# Patient Record
Sex: Male | Born: 1955 | Race: White | Hispanic: No | Marital: Married | Smoking: Former smoker
Health system: Southern US, Community
[De-identification: ages and names within clinical notes are randomized; demographics above are authoritative.]

## PROBLEM LIST (undated history)

## (undated) DIAGNOSIS — T7840XA Allergy, unspecified, initial encounter: Secondary | ICD-10-CM

## (undated) DIAGNOSIS — M1990 Unspecified osteoarthritis, unspecified site: Secondary | ICD-10-CM

## (undated) DIAGNOSIS — Z8489 Family history of other specified conditions: Secondary | ICD-10-CM

## (undated) DIAGNOSIS — J329 Chronic sinusitis, unspecified: Secondary | ICD-10-CM

## (undated) DIAGNOSIS — I209 Angina pectoris, unspecified: Secondary | ICD-10-CM

## (undated) HISTORY — PX: TONSILLECTOMY: SUR1361

## (undated) HISTORY — PX: ROOT CANAL: SHX2363

## (undated) HISTORY — DX: Allergy, unspecified, initial encounter: T78.40XA

## (undated) HISTORY — DX: Unspecified osteoarthritis, unspecified site: M19.90

---

## 1998-08-29 ENCOUNTER — Encounter: Payer: Self-pay | Admitting: Family Medicine

## 1999-12-19 ENCOUNTER — Other Ambulatory Visit: Admission: RE | Admit: 1999-12-19 | Discharge: 1999-12-19 | Payer: Self-pay | Admitting: Urology

## 1999-12-19 ENCOUNTER — Encounter (INDEPENDENT_AMBULATORY_CARE_PROVIDER_SITE_OTHER): Payer: Self-pay | Admitting: Specialist

## 2007-03-25 ENCOUNTER — Encounter: Payer: Self-pay | Admitting: Family Medicine

## 2007-03-25 ENCOUNTER — Encounter: Admission: RE | Admit: 2007-03-25 | Discharge: 2007-03-25 | Payer: Self-pay | Admitting: Family Medicine

## 2007-03-30 ENCOUNTER — Ambulatory Visit: Payer: Self-pay | Admitting: Internal Medicine

## 2007-04-27 ENCOUNTER — Ambulatory Visit: Payer: Self-pay | Admitting: Internal Medicine

## 2007-07-14 HISTORY — PX: KNEE SURGERY: SHX244

## 2007-10-21 ENCOUNTER — Ambulatory Visit (HOSPITAL_COMMUNITY): Admission: RE | Admit: 2007-10-21 | Discharge: 2007-10-21 | Payer: Self-pay | Admitting: Orthopaedic Surgery

## 2008-09-21 ENCOUNTER — Encounter: Payer: Self-pay | Admitting: Family Medicine

## 2008-11-21 ENCOUNTER — Ambulatory Visit (HOSPITAL_COMMUNITY): Admission: RE | Admit: 2008-11-21 | Discharge: 2008-11-21 | Payer: Self-pay | Admitting: Orthopaedic Surgery

## 2009-09-24 ENCOUNTER — Ambulatory Visit: Payer: Self-pay | Admitting: Family Medicine

## 2009-09-25 LAB — CONVERTED CEMR LAB
ALT: 25 units/L (ref 0–53)
Albumin: 4.2 g/dL (ref 3.5–5.2)
BUN: 15 mg/dL (ref 6–23)
Basophils Relative: 0.2 % (ref 0.0–3.0)
CO2: 32 meq/L (ref 19–32)
Chloride: 109 meq/L (ref 96–112)
Cholesterol: 192 mg/dL (ref 0–200)
Creatinine, Ser: 1.1 mg/dL (ref 0.4–1.5)
Eosinophils Absolute: 0.3 10*3/uL (ref 0.0–0.7)
Eosinophils Relative: 4.9 % (ref 0.0–5.0)
HCT: 46.1 % (ref 39.0–52.0)
LDL Cholesterol: 113 mg/dL — ABNORMAL HIGH (ref 0–99)
Lymphs Abs: 1.3 10*3/uL (ref 0.7–4.0)
MCHC: 33.5 g/dL (ref 30.0–36.0)
MCV: 100.4 fL — ABNORMAL HIGH (ref 78.0–100.0)
Monocytes Absolute: 0.4 10*3/uL (ref 0.1–1.0)
Neutrophils Relative %: 62.2 % (ref 43.0–77.0)
PSA: 0.59 ng/mL (ref 0.10–4.00)
Potassium: 5.4 meq/L — ABNORMAL HIGH (ref 3.5–5.1)
RBC: 4.59 M/uL (ref 4.22–5.81)
TSH: 2.09 microintl units/mL (ref 0.35–5.50)
Total CHOL/HDL Ratio: 4
Total Protein: 6.3 g/dL (ref 6.0–8.3)
Triglycerides: 125 mg/dL (ref 0.0–149.0)
WBC: 5.2 10*3/uL (ref 4.5–10.5)

## 2010-08-12 NOTE — Assessment & Plan Note (Signed)
Summary: BRAND NEW PT/TO EST/PT REQ CPX/PT COMING IN FASTING/CJR/pt rs...   Vital Signs:  Patient profile:   55 year old male Height:      70.75 inches Weight:      185 pounds BMI:     26.08 Temp:     97.9 degrees F oral Pulse rate:   60 / minute Pulse rhythm:   regular Resp:     12 per minute BP sitting:   120 / 78  (left arm) Cuff size:   regular  Vitals Entered By: Sid Falcon LPN (September 24, 2009 9:06 AM)  Nutrition Counseling: Patient's BMI is greater than 25 and therefore counseled on weight management options. CC: New to establish,  pt fasting   History of Present Illness: New patient to establish care. Patient here for complete physical.    Past medical history reviewed. Patient has seasonal and perennial allergies and sees allergist regularly. History of frequent sinus infections in the past. Previous left knee arthroscopic knee surgery. He has some osteoarthritis involving the left knee. Medications reviewed and he uses fluticasone nasal and Astelin nasal sprays. No known drug allergies.  Immunizations up to date. Tetanus booster 2005. Upcoming trip to [place] and received second hepatitis A recently through health Department.  Brother with multiple sclerosis. Brother and mother diagnosed with brain tumors. Elevated lipids father.  Patient had screening colonoscopy normal 2009. No history of smoking. Exercises regularly. 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 Alcohol use-yes Smoker as teenager, quit  Risk Factors: Smoking Status: quit (09/24/2009)  Past Medical History: Arthritis left knee Chicken pox Hay  Fever, allergies  Past Surgical History: Tonsillectomy1965 Arthroscopy knee surgery 10/2007  Family History: Family History High cholesterol, father MS, Brother Brain Tumor, mother/ brother  Social History: 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 Alcohol use-yes Smoker as teenager, quit Smoking Status:  quit Occupation:  employed  Review of Systems  The patient denies anorexia, fever, weight loss, weight gain, vision loss, decreased hearing, hoarseness, chest pain, syncope, dyspnea on exertion, peripheral edema, prolonged cough, headaches, hemoptysis, abdominal pain, melena, hematochezia, severe indigestion/heartburn, hematuria, incontinence, genital sores, muscle weakness, suspicious skin lesions, transient blindness, difficulty walking, depression, unusual weight change, abnormal bleeding, enlarged lymph nodes, and testicular masses.         nonpainful cystic swelling left ankle which he noticed recently.  Physical Exam  General:  Well-developed,well-nourished,in no acute distress; alert,appropriate and cooperative throughout examination Head:  Normocephalic and atraumatic without obvious abnormalities. No apparent alopecia or balding. Eyes:  No corneal or conjunctival inflammation noted. EOMI. Perrla. Funduscopic exam benign, without hemorrhages, exudates or papilledema. Vision grossly normal. Ears:  External ear exam shows no significant lesions or deformities.  Otoscopic examination reveals clear canals, tympanic membranes are intact bilaterally without bulging, retraction, inflammation or discharge. Hearing is grossly normal bilaterally. Mouth:  Oral mucosa and oropharynx without lesions or exudates.  Teeth in good repair. Neck:  No deformities, masses, or tenderness noted. Lungs:  Normal respiratory effort, chest expands symmetrically. Lungs are clear to auscultation, no crackles or wheezes. Heart:  Normal rate and regular rhythm. S1 and S2 normal without gallop, murmur, click,  rub or other extra sounds. Abdomen:  Bowel sounds positive,abdomen soft and non-tender  without masses, organomegaly or hernias noted. Rectal:  No external abnormalities noted. Normal sphincter tone. No rectal masses or tenderness. Prostate:  Prostate gland firm and smooth, no enlargement, nodularity, tenderness, mass, asymmetry or induration. Extremities:  large ganglion cyst left ankle. This is movable and well demarcated. Neurologic:  No cranial nerve deficits noted. Station and gait are normal. Plantar reflexes are down-going bilaterally. DTRs are symmetrical throughout. Sensory, motor and coordinative functions appear intact. Skin:  Intact without suspicious lesions or rashes Psych:  normally interactive, good eye contact, not anxious appearing, and not depressed appearing.  normally interactive, good eye contact, not anxious appearing, and not depressed appearing.     Impression & Recommendations:  Problem # 1:  Preventive Health Care (ICD-V70.0) check screening labs. Colonoscopy up to date.  Exercising regularly.  Immunizations up to date.  Pt traveling out of country and requests antibiotic to use only if he develops severe sinus infection which he has been prone to in past.  Complete Medication List: 1)  Astelin 137 Mcg/spray Soln (Azelastine hcl) .... Onece daily 2)  Fluticasone Propionate 50 Mcg/act Susp (Fluticasone propionate) .... One spray daily 3)  Azithromycin 250 Mg Tabs (Azithromycin) .... Use as directed. 4)  Mens Multivitamin Plus Tabs (Multiple vitamins-minerals) .... Once daily 5)  Zyrtec Allergy 10 Mg Tabs (Cetirizine hcl) .... As needed  Other Orders: Venipuncture (29562) TLB-Lipid Panel (80061-LIPID) TLB-BMP (Basic Metabolic Panel-BMET) (80048-METABOL) TLB-CBC Platelet - w/Differential (85025-CBCD) TLB-Hepatic/Liver Function Pnl (80076-HEPATIC) TLB-TSH (Thyroid Stimulating Hormone) (84443-TSH) TLB-PSA (Prostate Specific Antigen) (84153-PSA)  Patient  Instructions: 1)  Please schedule a follow-up appointment in 1 year.  2)  It is important that you exercise reguarly at least 20 minutes 5 times a week. If you develop chest pain, have severe difficulty breathing, or feel very tired, stop exercising immediately and seek medical attention.  Prescriptions: AZITHROMYCIN 250 MG TABS (AZITHROMYCIN) Use as directed.  #6 x 0   Entered and Authorized by:   Evelena Peat MD   Signed by:   Evelena Peat MD on 09/24/2009   Method used:   Print then Give to Patient   RxID:   [id number]   Preventive Care Screening  Colonoscopy:    Date:  07/14/2007    Results:  normal

## 2010-08-12 NOTE — Letter (Signed)
Summary: [facility]   Imported By: Maryln Gottron 09/26/2009 12:50:45  _____________________________________________________________________  External Attachment:    Type:   Image     Comment:   External Document

## 2010-09-24 ENCOUNTER — Other Ambulatory Visit (INDEPENDENT_AMBULATORY_CARE_PROVIDER_SITE_OTHER): Payer: BC Managed Care – PPO | Admitting: Family Medicine

## 2010-09-24 DIAGNOSIS — Z0000 Encounter for general adult medical examination without abnormal findings: Secondary | ICD-10-CM

## 2010-09-24 DIAGNOSIS — E785 Hyperlipidemia, unspecified: Secondary | ICD-10-CM

## 2010-09-24 LAB — POCT URINALYSIS DIPSTICK
Blood, UA: NEGATIVE
Glucose, UA: NEGATIVE
Leukocytes, UA: NEGATIVE
Nitrite, UA: NEGATIVE
Spec Grav, UA: 1.02
Urobilinogen, UA: 0.2
pH, UA: 7

## 2010-09-24 LAB — CBC WITH DIFFERENTIAL/PLATELET
Basophils Relative: 0.3 % (ref 0.0–3.0)
Eosinophils Absolute: 0.4 10*3/uL (ref 0.0–0.7)
Hemoglobin: 16.1 g/dL (ref 13.0–17.0)
Lymphocytes Relative: 25.2 % (ref 12.0–46.0)
MCHC: 35 g/dL (ref 30.0–36.0)
Monocytes Relative: 7.6 % (ref 3.0–12.0)
Neutrophils Relative %: 58.9 % (ref 43.0–77.0)
Platelets: 141 10*3/uL — ABNORMAL LOW (ref 150.0–400.0)
RBC: 4.59 Mil/uL (ref 4.22–5.81)
WBC: 4.9 10*3/uL (ref 4.5–10.5)

## 2010-09-24 LAB — TSH: TSH: 2.1 u[IU]/mL (ref 0.35–5.50)

## 2010-09-24 LAB — BASIC METABOLIC PANEL
BUN: 17 mg/dL (ref 6–23)
CO2: 30 mEq/L (ref 19–32)
Calcium: 9 mg/dL (ref 8.4–10.5)
Creatinine, Ser: 1 mg/dL (ref 0.4–1.5)
GFR: 82.26 mL/min (ref >60.00)
Sodium: 141 mEq/L (ref 135–145)

## 2010-09-24 LAB — PSA: PSA: 0.64 ng/mL (ref 0.10–4.00)

## 2010-09-24 LAB — LIPID PANEL
HDL: 45.2 mg/dL (ref >39.00)
Total CHOL/HDL Ratio: 5
VLDL: 28 mg/dL (ref 0.0–40.0)

## 2010-09-24 LAB — HEPATIC FUNCTION PANEL
AST: 29 U/L (ref 0–37)
Alkaline Phosphatase: 67 U/L (ref 39–117)
Bilirubin, Direct: 0.1 mg/dL (ref 0.0–0.3)
Total Protein: 6.1 g/dL (ref 6.0–8.3)

## 2010-10-01 ENCOUNTER — Encounter: Payer: Self-pay | Admitting: Family Medicine

## 2010-10-02 ENCOUNTER — Ambulatory Visit (INDEPENDENT_AMBULATORY_CARE_PROVIDER_SITE_OTHER): Payer: BC Managed Care – PPO | Admitting: Family Medicine

## 2010-10-02 ENCOUNTER — Encounter: Payer: Self-pay | Admitting: Family Medicine

## 2010-10-02 VITALS — BP 120/80 | HR 72 | Temp 98.6°F | Resp 12 | Ht 70.75 in | Wt 185.0 lb

## 2010-10-02 DIAGNOSIS — Z0000 Encounter for general adult medical examination without abnormal findings: Secondary | ICD-10-CM

## 2010-10-02 NOTE — Progress Notes (Signed)
  Subjective:    Patient ID: Edwin [name], male    DOB: 11/08/1955, 55 y.o.   MRN: [id number]  HPI  patient here for complete physical examination. Only takes a few allergy medications. Tetanus up-to-date. Colonoscopy 2009 and normal. Exercises several days per week. No recent complaints.  Former runner but unable to run now following knee injury.   Review of Systems  Constitutional: Negative for fever, activity change, appetite change and fatigue.  HENT: Negative for ear pain, congestion and trouble swallowing.   Eyes: Negative for pain and visual disturbance.  Respiratory: Negative for cough, shortness of breath and wheezing.   Cardiovascular: Negative for chest pain and palpitations.  Gastrointestinal: Negative for nausea, vomiting, abdominal pain, diarrhea, constipation, blood in stool, abdominal distention and rectal pain.  Genitourinary: Negative for dysuria, hematuria and testicular pain.  Musculoskeletal: Negative for joint swelling and arthralgias.  Skin: Negative for rash.  Neurological: Negative for dizziness, syncope and headaches.  Hematological: Negative for adenopathy.  Psychiatric/Behavioral: Negative for confusion and dysphoric mood.       Objective:   Physical Exam  Constitutional: He is oriented to person, place, and time. He appears well-developed and well-nourished. No distress.  HENT:  Head: Normocephalic and atraumatic.  Right Ear: External ear normal.  Left Ear: External ear normal.  Mouth/Throat: Oropharynx is clear and moist.  Eyes: Conjunctivae and EOM are normal. Pupils are equal, round, and reactive to light.  Neck: Normal range of motion. Neck supple. No thyromegaly present.  Cardiovascular: Normal rate, regular rhythm and normal heart sounds.   No murmur heard. Pulmonary/Chest: No respiratory distress. He has no wheezes. He has no rales.  Abdominal: Soft. Bowel sounds are normal. He exhibits no distension and no mass. There is no tenderness.  There is no rebound and no guarding.  Musculoskeletal: He exhibits no edema.  Lymphadenopathy:    He has no cervical adenopathy.  Neurological: He is alert and oriented to person, place, and time. He displays normal reflexes. No cranial nerve deficit.  Skin: No rash noted.  Psychiatric: He has a normal mood and affect.          Assessment & Plan:   complete physical examination. Labs reviewed with patient. Lipids slightly elevated but overall very low risk for CAD. Continue regular exercise. Colonoscopy up-to-date. Tetanus up to date. Annual physical in one year

## 2010-11-25 NOTE — Assessment & Plan Note (Signed)
[name]                             PULMONARY OFFICE NOTE   NAME:[name], Edwin [name]                    MRN:          [id number]  DATE:04/27/2007                            DOB:          15-Feb-1956    HISTORY:  A 55 year old white male with a cough dating back at least 6  months, worse at bedtime, associated with evidence of sinusitis and  apparent polyposis with the recommendation that surgery be considered.  The patient said he is 80% better after his last round of therapy,  which included treated directed at both reflux and rhinitis, as well as  sinusitis, but he is aggravated to some extent because his wife still  complains that he has been coughing at night.  The only time he had  complete control of the cough was while he was on Tramadol.  Note, he is  only on Flonase now.   He denies any significant dyspnea.  He is having somewhat discolored  sputum mostly from the nose, not much actually coughing up, except  minimum in the morning.   PHYSICAL EXAMINATION:  He is a pleasant ambulatory white male who is  somewhat anxious, in no acute distress.  He is afebrile with stable vital signs.  HEENT:  Significant for mild to moderate turbinate edema.  Oropharynx is  clear.  NECK:  Supple without cervical adenopathy or tenderness.  Trachea is  midline.  No thyromegaly.  LUNG FIELDS:  Completely clear bilaterally to auscultation and  percussion with no cough elicited on inspiratory or expiratory  maneuvers.  HEART:  Regular rhythm without murmur, gallop, or rub.  ABDOMEN:  Soft and benign.  EXTREMITIES:  Warm without calf tenderness, cyanosis, clubbing, or  edema.   IMPRESSION:  An 80% improvement in cough without the need for any form  of chronic therapy is actually quite good, but clearly is not going to  be eradicated completely unless the underlying source for the cough, his  chronic sinus disease, is managed aggressively.  If Dr. Suszanne Conners feels  that  surgery is needed, then I would fully endorse it.   To treat him acutely today, I recommended another 10-day course of  Augmentin, and a 6-day course of prednisone starting at 40 mg and  tapering off.  I also recommended, as long as he is coughing, to use  Zegerid 1 nightly because of the cyclical nature of cough that develops  in patients who have postnasal drip syndrome aggravated by coughing,  which aggravates reflux in a cyclical fashion.   I explained all of this to the patient in detail giving him both graphic  and text formatted material, and also diagramed out for him the problem  he is facing in terms of control of sinus disease in a way that I  thought he could understand it clearly, and hopefully buy in to the  concept that if  medical therapy does not work, then surgery needs to be considered as a  logical next step until the problem is corrected.     Charlaine Dalton. Sherene Sires, MD, [facility]  Electronically Signed  MBW/MedQ  DD: 04/27/2007  DT: 04/28/2007  Job #: [id number]   cc:   Oley Balm. Georgina Pillion, M.D.  Newman Pies, MD

## 2010-11-25 NOTE — Assessment & Plan Note (Signed)
[facility]                             PULMONARY OFFICE NOTE   NAME:[name], [name]                    MRN:          [id number]  DATE:03/30/2007                            DOB:          [birth year]    REFERRING PHYSICIAN:  Oley Balm. Georgina [name], M.D.   REASON FOR CONSULTATION:  Cough.   HISTORY:  The patient's says that all of his adult life he has had two  bad colds typically a year. They always start up in his head and end up  causing significant post-nasal drainage and just a little cough.  Typically he responds to antibiotics, but now comes in with a new onset  cough that started about six months ago also with a head cold that went  into the chest.  At this time, not responsive to Zithromax, but  somewhat better after taking prednisone. He relapsed shortly thereafter  and now is a experiencing severe paroxysms of cough especially when he  lies down at night, associated with a tickle in his suprasternal notch  area and has undergone an evaluation which included a chest x-ray and  sinus CT scan showing pan sinusitis and therefore has been referred to  Dr.  Donaciano [name] [name]. The patient denies any purulent secretions at  present, fevers, chills, sweats, orthopnea, PND or leg swelling. He is  not short of breath when he is not coughing.   PAST MEDICAL HISTORY:  1. Absence of significant chronic medical problems.  2. Status post remote tonsillectomy.   ALLERGIES:  None known.   MEDICATIONS:  1. Do not include ACE inhibitors, but do include:  2. Zyrtec.  3. Flonase.  4. Nexium, which he is not convinced help him.   SOCIAL HISTORY:  He quit smoking in 1979 after only smoking for 8 years.  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   FAMILY HISTORY:  Is positive for allergies in parents.   REVIEW OF SYSTEMS:  Taken in detail and negative except as outlined  above.   PHYSICAL EXAMINATION:  This is a stoic, ambulatory white male in no  acute  distress. He does talk with somewhat of a nasal tone to his voice.  He has stable vital signs and stable blood pressure at 140/98.  HEENT: Reveals severe turbinate edema, much worse on the left than on  the right with mucopurulent material present. Oropharynx is clear.  NECK: Supple without cervical adenopathy or tenderness. Trachea is  midline without lymphadenopathy.  LUNGS: Lung fields are clear bilaterally to auscultation and percussion,  but he does cough on deep inspiratory maneuvers.  HEART: Regular rate and rhythm without murmur, gallop or rub.  ABDOMEN: Soft, benign.  EXTREMITIES: Warm without calf tenderness, cyanosis, clubbing or edema.   CHEST X-RAY: Shows what appears to be prominent nipple shadows. No  definite abnormalities otherwise.   Sinus CT scan shows pan sinusitis as noted.   IMPRESSION:  Refractory cough is secondary to rhinitis or post-nasal  drip syndrome. I believe he does have a cyclical cough and endorse  continuing treatment for reflux associated with cyclical cough and gave  him information on cyclical cough as well as a diet recommending the  following specifics:  1. First, I have deferred totally the management of chronic sinusitis      to Dr.  Dorma [name] [name].  2. For cough, use Mucinex DM two b.i.d. supplemented by tramadol 50 mg      every four hours. For the inflammatory component take prednisone 10      mg tablets to be tapered #14 to be tapered off for six days.  3. To treat reflux, I have recommended he continue both Nexium in the      morning and Zegerid at bedtime (since many of his symptoms flare at      bedtime), but explained to him that I cough in this case is      probably causing reflux which is contributing to cough rather than      being a primary problem.     Edwin Dalton. Sherene Sires, MD, [facility]  Electronically Signed    MBW/MedQ  DD: 03/30/2007  DT: 03/30/2007  Job #: [id number]   cc:   Edwin [name], M.D.  Edwin [name], M.D.

## 2011-09-25 ENCOUNTER — Other Ambulatory Visit (INDEPENDENT_AMBULATORY_CARE_PROVIDER_SITE_OTHER): Payer: BC Managed Care – PPO

## 2011-09-25 DIAGNOSIS — Z0000 Encounter for general adult medical examination without abnormal findings: Secondary | ICD-10-CM

## 2011-09-25 LAB — CBC WITH DIFFERENTIAL/PLATELET
Basophils Absolute: 0 10*3/uL (ref 0.0–0.1)
Eosinophils Absolute: 0.3 10*3/uL (ref 0.0–0.7)
Lymphocytes Relative: 27.2 % (ref 12.0–46.0)
MCHC: 33.5 g/dL (ref 30.0–36.0)
Neutro Abs: 2.9 10*3/uL (ref 1.4–7.7)
Neutrophils Relative %: 57.6 % (ref 43.0–77.0)
Platelets: 153 10*3/uL (ref 150.0–400.0)
RDW: 12.3 % (ref 11.5–14.6)

## 2011-09-25 LAB — BASIC METABOLIC PANEL
CO2: 32 mEq/L (ref 19–32)
Calcium: 9.7 mg/dL (ref 8.4–10.5)
Creatinine, Ser: 1 mg/dL (ref 0.4–1.5)
Glucose, Bld: 93 mg/dL (ref 70–99)

## 2011-09-25 LAB — HEPATIC FUNCTION PANEL
Albumin: 4.2 g/dL (ref 3.5–5.2)
Alkaline Phosphatase: 63 U/L (ref 39–117)
Bilirubin, Direct: 0.1 mg/dL (ref 0.0–0.3)
Total Bilirubin: 0.7 mg/dL (ref 0.3–1.2)

## 2011-09-25 LAB — LIPID PANEL
Cholesterol: 215 mg/dL — ABNORMAL HIGH (ref 0–200)
VLDL: 26.2 mg/dL (ref 0.0–40.0)

## 2011-09-25 LAB — POCT URINALYSIS DIPSTICK
Ketones, UA: NEGATIVE
Leukocytes, UA: NEGATIVE
Protein, UA: NEGATIVE

## 2011-09-28 ENCOUNTER — Other Ambulatory Visit: Payer: BC Managed Care – PPO

## 2011-10-05 ENCOUNTER — Encounter: Payer: Self-pay | Admitting: Family Medicine

## 2011-10-05 ENCOUNTER — Ambulatory Visit (INDEPENDENT_AMBULATORY_CARE_PROVIDER_SITE_OTHER): Payer: BC Managed Care – PPO | Admitting: Family Medicine

## 2011-10-05 VITALS — BP 130/70 | HR 72 | Temp 97.4°F | Resp 12 | Ht 70.34 in | Wt 187.0 lb

## 2011-10-05 DIAGNOSIS — Z0000 Encounter for general adult medical examination without abnormal findings: Secondary | ICD-10-CM

## 2011-10-05 MED ORDER — AZITHROMYCIN 250 MG PO TABS: ORAL_TABLET | ORAL | Status: AC

## 2011-10-05 MED ORDER — DESONIDE 0.05 % EX LOTN: TOPICAL_LOTION | Freq: Two times a day (BID) | CUTANEOUS | Status: AC

## 2011-10-05 NOTE — Progress Notes (Signed)
  Subjective:    Patient ID: Edwin [name], male    DOB: [birth year], 56 y.o.   MRN: [id number]  HPI  Complete physical. Patient has perennial allergies treated with multiple medications. Continues to see allergist for immunotherapy. Eczematous rash intermittently lower extremities. Has used Elocon lotion with good success and requesting refill. Upcoming travel to [place]. Frequent sinusitis history. Requesting antibiotic in event of flareup.  Tetanus is up-to-date.  Past Medical History  Diagnosis Date  . Allergy   . Arthritis    Past Surgical History  Procedure Date  . Knee surgery 2009    microfracture surgery    reports that he quit smoking about 34 years ago. His smoking use included Cigarettes. He has a 5 pack-year smoking history. He does not have any smokeless tobacco history on file. He reports that he drinks alcohol. His drug history not on file. family history includes Hyperlipidemia in his father and Multiple sclerosis in his brother. No Known Allergies    Review of Systems  Constitutional: Negative for fever, activity change, appetite change and fatigue.  HENT: Negative for ear pain, congestion and trouble swallowing.   Eyes: Negative for pain and visual disturbance.  Respiratory: Negative for cough, shortness of breath and wheezing.   Cardiovascular: Negative for chest pain and palpitations.  Gastrointestinal: Negative for nausea, vomiting, abdominal pain, diarrhea, constipation, blood in stool, abdominal distention and rectal pain.  Genitourinary: Negative for dysuria, hematuria and testicular pain.  Musculoskeletal: Negative for joint swelling and arthralgias.  Skin: Negative for rash.  Neurological: Negative for dizziness, syncope and headaches.  Hematological: Negative for adenopathy.  Psychiatric/Behavioral: Negative for confusion and dysphoric mood.       Objective:   Physical Exam  Constitutional: He is oriented to person, place, and time. He appears  well-developed and well-nourished. No distress.  HENT:  Head: Normocephalic and atraumatic.  Right Ear: External ear normal.  Left Ear: External ear normal.  Mouth/Throat: Oropharynx is clear and moist.  Eyes: Conjunctivae and EOM are normal. Pupils are equal, round, and reactive to light.  Neck: Normal range of motion. Neck supple. No thyromegaly present.  Cardiovascular: Normal rate, regular rhythm and normal heart sounds.   No murmur heard. Pulmonary/Chest: No respiratory distress. He has no wheezes. He has no rales.  Abdominal: Soft. Bowel sounds are normal. He exhibits no distension and no mass. There is no tenderness. There is no rebound and no guarding.  Musculoskeletal: He exhibits no edema.  Lymphadenopathy:    He has no cervical adenopathy.  Neurological: He is alert and oriented to person, place, and time. He displays normal reflexes. No cranial nerve deficit.  Skin: Rash noted.       Patient has nonspecific slightly excoriated dry scaly rash scattered lower extremities  Psychiatric: He has a normal mood and affect.          Assessment & Plan:  Healthy 56 year old male. Colonoscopy up to date. Tetanus is up to date. Discussed diet and exercise. Refill Elocon lotion for as needed use

## 2012-04-26 ENCOUNTER — Encounter: Payer: Self-pay | Admitting: Family Medicine

## 2012-04-26 ENCOUNTER — Telehealth: Payer: Self-pay | Admitting: Family Medicine

## 2012-04-26 ENCOUNTER — Ambulatory Visit (INDEPENDENT_AMBULATORY_CARE_PROVIDER_SITE_OTHER): Payer: BC Managed Care – PPO | Admitting: Family Medicine

## 2012-04-26 VITALS — BP 120/80 | HR 68 | Temp 98.3°F | Wt 190.0 lb

## 2012-04-26 DIAGNOSIS — J329 Chronic sinusitis, unspecified: Secondary | ICD-10-CM

## 2012-04-26 DIAGNOSIS — R0602 Shortness of breath: Secondary | ICD-10-CM

## 2012-04-26 MED ORDER — AZITHROMYCIN 250 MG PO TABS: ORAL_TABLET | ORAL | Status: DC

## 2012-04-26 NOTE — Progress Notes (Signed)
  Subjective:    Patient ID: Edwin [name], male    DOB: 01/05/1956, 56 y.o.   MRN: [id number]  HPI Here for several reasons. First for about 10 days he has had some slight intermittent SOB. No coughing or wheezing, no chest pains, no GERD symptoms. He works out at [employer] 4 days a week, and he has had no trouble with exercising. Also last night while lying in bed he felt a slight fluttering in the chest that lasted about 30 minutes and stopped. He has not had any such symptoms since then. He also thinks he is getting a sinus infection, which he typically gets twice a year. He has had sinus pressure, PND, ST, and a HA. No fever.    Review of Systems  Constitutional: Negative.   HENT: Positive for congestion, postnasal drip and sinus pressure.   Eyes: Negative.   Respiratory: Positive for chest tightness. Negative for cough, shortness of breath and wheezing.   Cardiovascular: Negative.        Objective:   Physical Exam  Constitutional: He appears well-developed and well-nourished. No distress.  HENT:  Right Ear: External ear normal.  Left Ear: External ear normal.  Nose: Nose normal.  Mouth/Throat: Oropharynx is clear and moist. No oropharyngeal exudate.  Eyes: Conjunctivae normal are normal.  Neck: No thyromegaly present.  Cardiovascular: Normal rate, regular rhythm and intact distal pulses.  Exam reveals no gallop and no friction rub.   No murmur heard.      EKG normal with borderline LAFB   Pulmonary/Chest: Effort normal and breath sounds normal.  Lymphadenopathy:    He has no cervical adenopathy.          Assessment & Plan:  He has early sinusitis which we will treat with a Zpack. The etiology of his other chest symptoms is unclear but I think a cardiac issue is unlikely. He will follow up prn

## 2012-04-26 NOTE — Telephone Encounter (Signed)
Caller: [name]/Patient; Patient Name: Edwin [name]; PCP: Evelena Peat [facility]); Best Callback Phone Number: [phone]; Reason for call: Mild chest pain x 10 d. Felt "heart palpitations" last PM. Today having mild SOB, no heart palpitations. Sx have not affected him physically, still planning on going to the gym. See in 4 hours per Breathing Problems Protocol. Appt sched for 1115 today w/Dr. Clent Ridges.

## 2012-05-11 ENCOUNTER — Telehealth: Payer: Self-pay | Admitting: Family Medicine

## 2012-05-11 MED ORDER — AZITHROMYCIN 250 MG PO TABS: ORAL_TABLET | ORAL | Status: DC

## 2012-05-11 NOTE — Telephone Encounter (Signed)
Per Dr. Clent Ridges, okay to send in a refill for Zpack, which I did send e-scribe and left voice message for pt.

## 2012-05-11 NOTE — Telephone Encounter (Signed)
Per [employer], patient picked up an rx for a zpac on 04/26/12. Per pharmacy patient is requesting another rx for a zpac as his symptoms still persist. Please advise/assist.

## 2012-05-11 NOTE — Telephone Encounter (Signed)
Pt was seen on 10/15 for sinus inf and never received his Z-Pac.

## 2012-05-11 NOTE — Telephone Encounter (Signed)
Pt was seen by Dr Clent Ridges for sinus inf and never received his RX for a Z-PAC. Pt states he is going out of town 05/12/12 and would appreciate today if possible. [facility] [address]  [phone]

## 2012-08-09 ENCOUNTER — Telehealth: Payer: Self-pay | Admitting: Family Medicine

## 2012-08-09 NOTE — Telephone Encounter (Signed)
Patient Information:  Caller Name: Jamonta  Phone: [phone]  Patient: Edwin [name], Edwin [name]  Gender: Male  DOB: 12-11-1955  Age: 57 Years  PCP: Evelena Peat [facility])  Office Follow Up:  Does the office need to follow up with this patient?: No  Instructions For The Office: N/A   Symptoms  Reason For Call & Symptoms: Pt calling for antibiotic for sinus congestion.  Offered appt.  Pt took a Z-Pac 3 weeks ago.  SX  restarted 07/26/12.  His wife has been sick.  He knows he needs an antibiotic and asked for an appt.   Reviewed Health History In EMR: N/A  Reviewed Medications In EMR: N/A  Reviewed Allergies In EMR: N/A  Reviewed Surgeries / Procedures: N/A  Date of Onset of Symptoms: 08/05/2012  Treatments Tried: Z-Pac 3 weeks ago.  Treatments Tried Worked: No  Guideline(s) Used:  Sinus Pain and Congestion  Disposition Per Guideline:   See Today or Tomorrow in Office  Reason For Disposition Reached:   Sinus congestion (pressure, fullness) present > 10 days  Advice Given:  Reassurance:   Usually home treatment with nasal washes can prevent an actual bacterial sinus infection.  Reassurance:   Usually home treatment with nasal washes can prevent an actual bacterial sinus infection.  Call Back If:   You become worse.  Appointment Scheduled:  08/10/2012 13:45:00 Appointment Scheduled Provider:  Evelena Peat [facility])

## 2012-08-10 ENCOUNTER — Ambulatory Visit (INDEPENDENT_AMBULATORY_CARE_PROVIDER_SITE_OTHER): Payer: BC Managed Care – PPO | Admitting: Family Medicine

## 2012-08-10 ENCOUNTER — Encounter: Payer: Self-pay | Admitting: Family Medicine

## 2012-08-10 VITALS — BP 120/72 | Temp 98.5°F

## 2012-08-10 DIAGNOSIS — J329 Chronic sinusitis, unspecified: Secondary | ICD-10-CM

## 2012-08-10 MED ORDER — AMOXICILLIN-POT CLAVULANATE 875-125 MG PO TABS: 1.0000 | ORAL_TABLET | Freq: Two times a day (BID) | ORAL | Status: DC

## 2012-08-10 NOTE — Progress Notes (Signed)
  Subjective:    Patient ID: Edwin [name], male    DOB: 1956-01-15, 57 y.o.   MRN: [id number]  HPI One month ago ?sinusitis and took Z-max and saw some improvement. One week ago recurrent symptoms of nasal colored discharge. No headaches.  PND.  Malaise.  No sore throat. No fever.  Bilateral facial pain.   Review of Systems  Constitutional: Negative for fever and chills.  HENT: Positive for congestion, postnasal drip and sinus pressure.   Respiratory: Positive for cough.   Neurological: Negative for headaches.       Objective:   Physical Exam  HENT:  Right Ear: External ear normal.  Left Ear: External ear normal.  Mouth/Throat: Oropharynx is clear and moist.  Neck: Neck supple.  Cardiovascular: Normal rate and regular rhythm.   Pulmonary/Chest: Effort normal and breath sounds normal. No respiratory distress. He has no wheezes. He has no rales.  Lymphadenopathy:    He has no cervical adenopathy.          Assessment & Plan:  Acute sinusitis.  Augmentin 875 mg po bid for 10 days.

## 2012-08-10 NOTE — Patient Instructions (Addendum)

## 2012-09-13 ENCOUNTER — Other Ambulatory Visit: Payer: BC Managed Care – PPO

## 2012-09-20 ENCOUNTER — Other Ambulatory Visit (INDEPENDENT_AMBULATORY_CARE_PROVIDER_SITE_OTHER): Payer: BC Managed Care – PPO

## 2012-09-20 DIAGNOSIS — Z0000 Encounter for general adult medical examination without abnormal findings: Secondary | ICD-10-CM

## 2012-09-20 LAB — POCT URINALYSIS DIPSTICK
Bilirubin, UA: NEGATIVE
Blood, UA: NEGATIVE
Glucose, UA: NEGATIVE
Spec Grav, UA: 1.02

## 2012-09-20 LAB — CBC WITH DIFFERENTIAL/PLATELET
Basophils Relative: 0.3 % (ref 0.0–3.0)
Eosinophils Absolute: 0.5 10*3/uL (ref 0.0–0.7)
Hemoglobin: 15.8 g/dL (ref 13.0–17.0)
Lymphs Abs: 1 10*3/uL (ref 0.7–4.0)
MCHC: 34.8 g/dL (ref 30.0–36.0)
MCV: 96.8 fl (ref 78.0–100.0)
Monocytes Absolute: 0.3 10*3/uL (ref 0.1–1.0)
Neutro Abs: 2.7 10*3/uL (ref 1.4–7.7)
Neutrophils Relative %: 60.2 % (ref 43.0–77.0)
RBC: 4.69 Mil/uL (ref 4.22–5.81)

## 2012-09-20 LAB — BASIC METABOLIC PANEL
BUN: 14 mg/dL (ref 6–23)
Calcium: 9.2 mg/dL (ref 8.4–10.5)
Creatinine, Ser: 1 mg/dL (ref 0.4–1.5)
GFR: 86.65 mL/min (ref >60.00)

## 2012-09-20 LAB — HEPATIC FUNCTION PANEL
AST: 29 U/L (ref 0–37)
Alkaline Phosphatase: 63 U/L (ref 39–117)
Total Bilirubin: 1.2 mg/dL (ref 0.3–1.2)

## 2012-09-20 LAB — TSH: TSH: 2.11 u[IU]/mL (ref 0.35–5.50)

## 2012-09-20 LAB — LDL CHOLESTEROL, DIRECT: Direct LDL: 143.8 mg/dL

## 2012-09-20 LAB — LIPID PANEL: Cholesterol: 215 mg/dL — ABNORMAL HIGH (ref 0–200)

## 2012-09-20 LAB — PSA: PSA: 0.74 ng/mL (ref 0.10–4.00)

## 2012-09-22 ENCOUNTER — Other Ambulatory Visit: Payer: BC Managed Care – PPO

## 2012-10-05 ENCOUNTER — Ambulatory Visit (INDEPENDENT_AMBULATORY_CARE_PROVIDER_SITE_OTHER): Payer: BC Managed Care – PPO | Admitting: Family Medicine

## 2012-10-05 ENCOUNTER — Encounter: Payer: Self-pay | Admitting: Family Medicine

## 2012-10-05 VITALS — BP 130/84 | HR 72 | Temp 98.5°F | Resp 12 | Wt 187.0 lb

## 2012-10-05 DIAGNOSIS — Z23 Encounter for immunization: Secondary | ICD-10-CM

## 2012-10-05 DIAGNOSIS — Z0000 Encounter for general adult medical examination without abnormal findings: Secondary | ICD-10-CM

## 2012-10-05 MED ORDER — LEVOFLOXACIN 500 MG PO TABS: 500.0000 mg | ORAL_TABLET | Freq: Every day | ORAL | Status: DC

## 2012-10-05 MED ORDER — PREDNISONE 20 MG PO TABS: ORAL_TABLET | ORAL | Status: DC

## 2012-10-05 NOTE — Addendum Note (Signed)
Addended by: Melchor Amour on: 10/05/2012 09:53 AM   Modules accepted: Orders

## 2012-10-05 NOTE — Progress Notes (Signed)
  Subjective:    Patient ID: Edwin [name], male    DOB: October 26, 1955, 57 y.o.   MRN: [id number]  HPI Here for complete physical He has history of perennial allergies and sees allergist. Recent sinusitis issues. Was treated with Zithromax and subsequently Augmentin. Still has some greenish nasal discharge and congestion. Previously years ago was treated with combination of prednisone and antibiotic and clear.  Last tetanus 2005. Colonoscopy 2009. Past medical history, family history, social history reviewed and as below. Exercises with tennis.  Past Medical History  Diagnosis Date  . Allergy   . Arthritis    Past Surgical History  Procedure Laterality Date  . Knee surgery  2009    microfracture surgery    reports that he quit smoking about 35 years ago. His smoking use included Cigarettes. He has a 5 pack-year smoking history. He has never used smokeless tobacco. He reports that he drinks about 2.0 ounces of alcohol per week. He reports that he does not use illicit drugs. family history includes Dementia in his father; Hyperlipidemia in his father; and Multiple sclerosis in his brother. No Known Allergies    Review of Systems  Constitutional: Negative for fever, activity change, appetite change and fatigue.  HENT: Positive for congestion and sinus pressure. Negative for ear pain and trouble swallowing.   Eyes: Negative for pain and visual disturbance.  Respiratory: Positive for cough. Negative for shortness of breath and wheezing.   Cardiovascular: Negative for chest pain and palpitations.  Gastrointestinal: Negative for nausea, vomiting, abdominal pain, diarrhea, constipation, blood in stool, abdominal distention and rectal pain.  Genitourinary: Negative for dysuria, hematuria and testicular pain.  Musculoskeletal: Negative for joint swelling and arthralgias.  Skin: Negative for rash.  Neurological: Negative for dizziness, syncope and headaches.  Hematological: Negative for  adenopathy.  Psychiatric/Behavioral: Negative for confusion and dysphoric mood.       Objective:   Physical Exam  Constitutional: He is oriented to person, place, and time. He appears well-developed and well-nourished. No distress.  HENT:  Head: Normocephalic and atraumatic.  Right Ear: External ear normal.  Left Ear: External ear normal.  Mouth/Throat: Oropharynx is clear and moist.  Eyes: Conjunctivae and EOM are normal. Pupils are equal, round, and reactive to light.  Neck: Normal range of motion. Neck supple. No thyromegaly present.  Cardiovascular: Normal rate, regular rhythm and normal heart sounds.   No murmur heard. Pulmonary/Chest: No respiratory distress. He has no wheezes. He has no rales.  Abdominal: Soft. Bowel sounds are normal. He exhibits no distension and no mass. There is no tenderness. There is no rebound and no guarding.  Genitourinary: Rectum normal and prostate normal.  Musculoskeletal: He exhibits no edema.  Lymphadenopathy:    He has no cervical adenopathy.  Neurological: He is alert and oriented to person, place, and time. He displays normal reflexes. No cranial nerve deficit.  Skin: No rash noted.  Psychiatric: He has a normal mood and affect.          Assessment & Plan:  Complete physical. Tetanus booster given. Labs reviewed with no significant abnormalities. Colonoscopy up to date Persistent sinusitis symptoms. Prednisone 20 mg 2 tablets daily for 7 days and Levaquin 500 mg once daily for 10 days. Consider limited CT maxillofacial if no improvement in 2 weeks

## 2013-01-16 ENCOUNTER — Telehealth: Payer: Self-pay | Admitting: Family Medicine

## 2013-01-16 MED ORDER — SILDENAFIL CITRATE 100 MG PO TABS: 50.0000 mg | ORAL_TABLET | Freq: Every day | ORAL | Status: DC | PRN

## 2013-01-16 NOTE — Telephone Encounter (Signed)
Please advise 

## 2013-01-16 NOTE — Telephone Encounter (Signed)
Patient Information:  Caller Name: Edwin [name]  Phone: [phone]  Patient: Edwin [name], Edwin [name]  Gender: Male  DOB: [birth year]  Age: 57 Years  PCP: Evelena Peat [facility])  Office Follow Up:  Does the office need to follow up with this patient?: No  Instructions For The Office: N/A  RN Note:  Patient had question concerning prescription received 01/16/2013.  Pharmacist informed patient that the cost for this was $175.00.  Caller asked if we had any coupon available.  I referred him to the following website:  [email or web address].  RN advised him to call back if he had any issues getting the free trial offered on website.  Symptoms  Reason For Call & Symptoms: Patient had question concerning prescription received 01/16/2013.  Pharmacist informed patient that the cost for this was $175.00.  Caller asked if we had any coupon available.  I referred him to the following website:  [email or web address].[email or web address].  RN advised him to call back if he had any issues getting the free trial offered on website.  Reviewed Health History In EMR: Yes  Reviewed Medications In EMR: Yes  Reviewed Allergies In EMR: Yes  Reviewed Surgeries / Procedures: Yes  Date of Onset of Symptoms: 01/16/2013  Guideline(s) Used:  No Protocol Available - Information Only  Disposition Per Guideline:   Home Care  Reason For Disposition Reached:   Information only question and nurse able to answer  Advice Given:  Call Back If:  New symptoms develop  You become worse.  Patient Will Follow Care Advice:  YES

## 2013-01-16 NOTE — Telephone Encounter (Signed)
Okay to start Viagra 100 mg one half to one tablet daily as needed, dispense #60 tablets with 6 refills

## 2013-01-16 NOTE — Telephone Encounter (Signed)
Rx request to pharmacy/SLS  

## 2013-01-16 NOTE — Telephone Encounter (Signed)
Patient Information:  Caller Name: Edwin [name]  Phone: [phone]  Patient: Edwin, [name]  Gender: Male  DOB: May 22, 1956  Age: 57 Years  PCP: Evelena Peat (Family Practice)  Office Follow Up:  Does the office need to follow up with this patient?: Yes  Instructions For The Office: OFFICE PLEASE REVIEW REGARDING STARTING NEW RX OF VIAGRA PER PT REQUEST  RN Note:  Instructed pt to check back at pharmacy today  or if there is any issues office will call him back; will comply  Symptoms  Reason For Call & Symptoms: Pt is calling and states that he would like to get a new Rx for Viagra; sx started approx 1 year ago;  pt states that he is having a hard time maintaining an erection;  pt was last seen in March 2014; pt denies any significant hx or medication use;  he has talked to other men and would like to try the Viagra over the "other ones" out there;  [employer]  [phone];  Reviewed Health History In EMR: Yes  Reviewed Medications In EMR: Yes  Reviewed Allergies In EMR: Yes  Reviewed Surgeries / Procedures: Yes  Date of Onset of Symptoms: Unknown  Guideline(s) Used:  No Protocol Available - Information Only  Disposition Per Guideline:   Discuss with PCP and Callback by Nurse Today  Reason For Disposition Reached:   Nursing judgment  Advice Given:  Call Back If:  New symptoms develop  Patient Will Follow Care Advice:  YES

## 2013-08-21 ENCOUNTER — Encounter: Payer: Self-pay | Admitting: Family Medicine

## 2013-08-21 ENCOUNTER — Ambulatory Visit (INDEPENDENT_AMBULATORY_CARE_PROVIDER_SITE_OTHER): Payer: BC Managed Care – PPO | Admitting: Family Medicine

## 2013-08-21 VITALS — BP 130/72 | HR 71 | Temp 97.7°F | Wt 187.0 lb

## 2013-08-21 DIAGNOSIS — J329 Chronic sinusitis, unspecified: Secondary | ICD-10-CM

## 2013-08-21 MED ORDER — AZITHROMYCIN 250 MG PO TABS: ORAL_TABLET | ORAL | Status: DC

## 2013-08-21 NOTE — Progress Notes (Signed)
   Subjective:    Patient ID: Edwin [name], male    DOB: August 31, 1955, 58 y.o.   MRN: [id number]  Sinus Problem Associated symptoms include congestion, coughing and headaches. Pertinent negatives include no chills or ear pain.   Patient seen with over one week history of progressive sinus pressure and pain. He's had some yellow-green nasal discharge. Occasional cough. Occasional headaches. Progressive maxillary facial pain bilaterally. He has taken over-the-counter medications but has had progressive symptoms. He has regular allergies and sees an allergist. He is chronically on antihistamines and nasal steroid sprays. He's had almost 2 full weeks of sinus congestive symptoms.  Past Medical History  Diagnosis Date  . Allergy   . Arthritis    Past Surgical History  Procedure Laterality Date  . Knee surgery  2009    microfracture surgery    reports that he quit smoking about 35 years ago. His smoking use included Cigarettes. He has a 5 pack-year smoking history. He has never used smokeless tobacco. He reports that he drinks about 2.0 ounces of alcohol per week. He reports that he does not use illicit drugs. family history includes Dementia in his father; Hyperlipidemia in his father; Multiple sclerosis in his brother. No Known Allergies    Review of Systems  Constitutional: Negative for fever, chills and fatigue.  HENT: Positive for congestion. Negative for ear discharge and ear pain.   Respiratory: Positive for cough.   Neurological: Positive for headaches.       Objective:   Physical Exam  Constitutional: He appears well-developed and well-nourished.  HENT:  Right Ear: External ear normal.  Left Ear: External ear normal.  Mouth/Throat: Oropharynx is clear and moist.  Erythematous nasal mucosa. Otherwise normal  Neck: Neck supple.  Cardiovascular: Normal rate.   Pulmonary/Chest: Effort normal and breath sounds normal. No respiratory distress. He has no wheezes. He has no  rales.  Lymphadenopathy:    He has no cervical adenopathy.          Assessment & Plan:  Acute sinusitis. Zithromax for 5 days. Followup as needed

## 2013-08-21 NOTE — Patient Instructions (Signed)

## 2013-08-31 ENCOUNTER — Telehealth: Payer: Self-pay | Admitting: Family Medicine

## 2013-08-31 MED ORDER — LEVOFLOXACIN 500 MG PO TABS: 500.0000 mg | ORAL_TABLET | Freq: Every day | ORAL | Status: DC

## 2013-08-31 NOTE — Telephone Encounter (Signed)
Pt was just in on last week. Not feeling better, he and Dr. B discussed taking the z pack however it did not work.  Wants to know can a stronger antibiotic be called in, ?levaflaxin?  Please advise.

## 2013-08-31 NOTE — Telephone Encounter (Signed)
Levaquin 500 mg once daily for 10 days. If not improving with that we need to consider sinus x-rays

## 2013-08-31 NOTE — Telephone Encounter (Signed)
RX sent to pharmacy and Pt informed 

## 2013-09-12 ENCOUNTER — Telehealth: Payer: Self-pay | Admitting: Family Medicine

## 2013-09-12 NOTE — Telephone Encounter (Signed)
Pt has appt on thurs, but wants to know if you would rx  a cough med for him? Pt states he has coughed non stop for 2 days. [facility] Pt aware dr is out of the office this afternoon and ok w/ that.

## 2013-09-13 ENCOUNTER — Ambulatory Visit (INDEPENDENT_AMBULATORY_CARE_PROVIDER_SITE_OTHER): Payer: BC Managed Care – PPO | Admitting: Family Medicine

## 2013-09-13 ENCOUNTER — Encounter: Payer: Self-pay | Admitting: Family Medicine

## 2013-09-13 VITALS — BP 130/72 | HR 95 | Temp 98.8°F | Wt 184.0 lb

## 2013-09-13 DIAGNOSIS — J209 Acute bronchitis, unspecified: Secondary | ICD-10-CM

## 2013-09-13 MED ORDER — HYDROCODONE-HOMATROPINE 5-1.5 MG/5ML PO SYRP: 5.0000 mL | ORAL_SOLUTION | Freq: Four times a day (QID) | ORAL | Status: DC | PRN

## 2013-09-13 NOTE — Progress Notes (Signed)
   Subjective:    Patient ID: Edwin [name], male    DOB: 10/13/1955, 58 y.o.   MRN: [id number]  Cough Associated symptoms include chills, a fever and myalgias. Pertinent negatives include no rash or sore throat.   Acute visit. Patient recently got over sinus infection after taking Zithromax and subsequently Levaquin. He did feel totally recovered following that but within about 5 days ago developed some cough and body aches. 3 day history of low-grade fever. Minimal diarrhea. Rhinorrhea. Intermittent chills. No nausea or vomiting. Cough is especially bothersome. Tried Robitussin over-the-counter without much improvement. Patient nonsmoker. No dyspnea  Past Medical History  Diagnosis Date  . Allergy   . Arthritis    Past Surgical History  Procedure Laterality Date  . Knee surgery  2009    microfracture surgery    reports that he quit smoking about 35 years ago. His smoking use included Cigarettes. He has a 5 pack-year smoking history. He has never used smokeless tobacco. He reports that he drinks about 2.0 ounces of alcohol per week. He reports that he does not use illicit drugs. family history includes Dementia in his father; Hyperlipidemia in his father; Multiple sclerosis in his brother. No Known Allergies    Review of Systems  Constitutional: Positive for fever, chills and fatigue.  HENT: Positive for congestion. Negative for sore throat.   Respiratory: Positive for cough.   Musculoskeletal: Positive for myalgias.  Skin: Negative for rash.       Objective:   Physical Exam  Constitutional: He appears well-developed and well-nourished.  HENT:  Right Ear: External ear normal.  Left Ear: External ear normal.  Mouth/Throat: Oropharynx is clear and moist.  Neck: Neck supple.  Cardiovascular: Normal rate and regular rhythm.   Pulmonary/Chest: Effort normal and breath sounds normal. No respiratory distress. He has no wheezes. He has no rales.  Lymphadenopathy:    He has no  cervical adenopathy.          Assessment & Plan:  Viral syndrome. Nonfocal exam. Treat symptomatically. Hycodan cough syrup for nighttime suppression as needed.

## 2013-09-13 NOTE — Progress Notes (Signed)
Pre visit review using our clinic review tool, if applicable. No additional management support is needed unless otherwise documented below in the visit note. 

## 2013-09-13 NOTE — Patient Instructions (Signed)
Acute Bronchitis Bronchitis is inflammation of the airways that extend from the windpipe into the lungs (bronchi). The inflammation often causes mucus to develop. This leads to a cough, which is the most common symptom of bronchitis.  In acute bronchitis, the condition usually develops suddenly and goes away over time, usually in a couple weeks. Smoking, allergies, and asthma can make bronchitis worse. Repeated episodes of bronchitis may cause further lung problems.  CAUSES Acute bronchitis is most often caused by the same virus that causes a cold. The virus can spread from person to person (contagious).  SIGNS AND SYMPTOMS   Cough.   Fever.   Coughing up mucus.   Body aches.   Chest congestion.   Chills.   Shortness of breath.   Sore throat.  DIAGNOSIS  Acute bronchitis is usually diagnosed through a physical exam. Tests, such as chest X-rays, are sometimes done to rule out other conditions.  TREATMENT  Acute bronchitis usually goes away in a couple weeks. Often times, no medical treatment is necessary. Medicines are sometimes given for relief of fever or cough. Antibiotics are usually not needed but may be prescribed in certain situations. In some cases, an inhaler may be recommended to help reduce shortness of breath and control the cough. A cool mist vaporizer may also be used to help thin bronchial secretions and make it easier to clear the chest.  HOME CARE INSTRUCTIONS  Get plenty of rest.   Drink enough fluids to keep your urine clear or pale yellow (unless you have a medical condition that requires fluid restriction). Increasing fluids may help thin your secretions and will prevent dehydration.   Only take over-the-counter or prescription medicines as directed by your health care provider.   Avoid smoking and secondhand smoke. Exposure to cigarette smoke or irritating chemicals will make bronchitis worse. If you are a smoker, consider using nicotine gum or skin  patches to help control withdrawal symptoms. Quitting smoking will help your lungs heal faster.   Reduce the chances of another bout of acute bronchitis by washing your hands frequently, avoiding people with cold symptoms, and trying not to touch your hands to your mouth, nose, or eyes.   Follow up with your health care provider as directed.  SEEK MEDICAL CARE IF: Your symptoms do not improve after 1 week of treatment.  SEEK IMMEDIATE MEDICAL CARE IF:  You develop an increased fever or chills.   You have chest pain.   You have severe shortness of breath.  You have bloody sputum.   You develop dehydration.  You develop fainting.  You develop repeated vomiting.  You develop a severe headache. MAKE SURE YOU:   Understand these instructions.  Will watch your condition.  Will get help right away if you are not doing well or get worse. Document Released: 08/06/2004 Document Revised: 03/01/2013 Document Reviewed: 12/20/2012 [employer] Patient Information 2014 [employer].  

## 2013-09-13 NOTE — Telephone Encounter (Signed)
Disregard  Request. Pt is feeling worse and will be in at 9:45 this am. Wife thinks he may have pneumonia.

## 2013-09-14 ENCOUNTER — Ambulatory Visit: Payer: BC Managed Care – PPO | Admitting: Family Medicine

## 2013-09-25 ENCOUNTER — Other Ambulatory Visit (INDEPENDENT_AMBULATORY_CARE_PROVIDER_SITE_OTHER): Payer: BC Managed Care – PPO

## 2013-09-25 DIAGNOSIS — Z0000 Encounter for general adult medical examination without abnormal findings: Secondary | ICD-10-CM

## 2013-09-25 LAB — PSA: PSA: 0.83 ng/mL (ref 0.10–4.00)

## 2013-09-25 LAB — POCT URINALYSIS DIPSTICK
GLUCOSE UA: NEGATIVE
Leukocytes, UA: NEGATIVE
Nitrite, UA: NEGATIVE
Protein, UA: NEGATIVE
RBC UA: NEGATIVE
SPEC GRAV UA: 1.015
UROBILINOGEN UA: 0.2
pH, UA: 6

## 2013-09-25 LAB — LIPID PANEL
CHOLESTEROL: 204 mg/dL — AB (ref 0–200)
HDL: 37.9 mg/dL — ABNORMAL LOW (ref >39.00)
LDL CALC: 135 mg/dL — AB (ref 0–99)
TRIGLYCERIDES: 156 mg/dL — AB (ref 0.0–149.0)
Total CHOL/HDL Ratio: 5
VLDL: 31.2 mg/dL (ref 0.0–40.0)

## 2013-09-25 LAB — CBC WITH DIFFERENTIAL/PLATELET
BASOS ABS: 0 10*3/uL (ref 0.0–0.1)
Basophils Relative: 0.3 % (ref 0.0–3.0)
EOS PCT: 4.8 % (ref 0.0–5.0)
Eosinophils Absolute: 0.3 10*3/uL (ref 0.0–0.7)
HCT: 42.7 % (ref 39.0–52.0)
Hemoglobin: 14.7 g/dL (ref 13.0–17.0)
Lymphocytes Relative: 19.1 % (ref 12.0–46.0)
Lymphs Abs: 1.1 10*3/uL (ref 0.7–4.0)
MCHC: 34.5 g/dL (ref 30.0–36.0)
MCV: 97.1 fl (ref 78.0–100.0)
MONO ABS: 0.5 10*3/uL (ref 0.1–1.0)
MONOS PCT: 8.9 % (ref 3.0–12.0)
NEUTROS PCT: 66.9 % (ref 43.0–77.0)
Neutro Abs: 4 10*3/uL (ref 1.4–7.7)
PLATELETS: 243 10*3/uL (ref 150.0–400.0)
RBC: 4.4 Mil/uL (ref 4.22–5.81)
RDW: 12.5 % (ref 11.5–14.6)
WBC: 6 10*3/uL (ref 4.5–10.5)

## 2013-09-25 LAB — BASIC METABOLIC PANEL
BUN: 12 mg/dL (ref 6–23)
CO2: 30 mEq/L (ref 19–32)
CREATININE: 0.9 mg/dL (ref 0.4–1.5)
Calcium: 9.4 mg/dL (ref 8.4–10.5)
Chloride: 108 mEq/L (ref 96–112)
GFR: 87.41 mL/min (ref >60.00)
Glucose, Bld: 86 mg/dL (ref 70–99)
Potassium: 4.6 mEq/L (ref 3.5–5.1)
Sodium: 143 mEq/L (ref 135–145)

## 2013-09-25 LAB — HEPATIC FUNCTION PANEL
ALBUMIN: 4.1 g/dL (ref 3.5–5.2)
ALT: 33 U/L (ref 0–53)
AST: 29 U/L (ref 0–37)
Alkaline Phosphatase: 65 U/L (ref 39–117)
Bilirubin, Direct: 0.1 mg/dL (ref 0.0–0.3)
Total Bilirubin: 1.3 mg/dL — ABNORMAL HIGH (ref 0.3–1.2)
Total Protein: 6.1 g/dL (ref 6.0–8.3)

## 2013-09-25 LAB — TSH: TSH: 1.62 u[IU]/mL (ref 0.35–5.50)

## 2013-10-05 ENCOUNTER — Encounter: Payer: Self-pay | Admitting: Family Medicine

## 2013-10-05 ENCOUNTER — Ambulatory Visit (INDEPENDENT_AMBULATORY_CARE_PROVIDER_SITE_OTHER): Payer: BC Managed Care – PPO | Admitting: Family Medicine

## 2013-10-05 VITALS — BP 128/82 | HR 66 | Temp 97.8°F | Ht 70.0 in | Wt 182.0 lb

## 2013-10-05 DIAGNOSIS — Z0000 Encounter for general adult medical examination without abnormal findings: Secondary | ICD-10-CM

## 2013-10-05 MED ORDER — AZITHROMYCIN 250 MG PO TABS: ORAL_TABLET | ORAL | Status: DC

## 2013-10-05 MED ORDER — HYDROCODONE-HOMATROPINE 5-1.5 MG/5ML PO SYRP: 5.0000 mL | ORAL_SOLUTION | Freq: Four times a day (QID) | ORAL | Status: AC | PRN

## 2013-10-05 MED ORDER — PREDNISONE 10 MG PO TABS: ORAL_TABLET | ORAL | Status: DC

## 2013-10-05 MED ORDER — AZITHROMYCIN 250 MG PO TABS: ORAL_TABLET | ORAL | Status: AC

## 2013-10-05 NOTE — Progress Notes (Signed)
Pre visit review using our clinic review tool, if applicable. No additional management support is needed unless otherwise documented below in the visit note. 

## 2013-10-05 NOTE — Progress Notes (Signed)
   Subjective:    Patient ID: Edwin [name], male    DOB: 1955/10/02, 58 y.o.   MRN: [id number]  HPI Patient seen for complete physical. Has had recent recurrent sinusitis has had 2 different go rounds of antibiotic. Feels his sinuses overall better though he still he has some postnasal drip symptoms. He does have some chronic allergies. He takes combination antihistamine nasal steroid nose spray. He has cough which is dry. No fever. No chills. No dyspnea. No obvious wheezing. No GERD symptoms. Never smoked. In the past, has taken steroids which have helped his chronic allergies and postnasal drip symptoms.  Past Medical History  Diagnosis Date  . Allergy   . Arthritis    Past Surgical History  Procedure Laterality Date  . Knee surgery  2009    microfracture surgery    reports that he quit smoking about 36 years ago. His smoking use included Cigarettes. He has a 5 pack-year smoking history. He has never used smokeless tobacco. He reports that he drinks about 2.0 ounces of alcohol per week. He reports that he does not use illicit drugs. family history includes Dementia in his father; Hyperlipidemia in his father; Multiple sclerosis in his brother. No Known Allergies   Review of Systems  Constitutional: Negative for fever, activity change, appetite change and fatigue.  HENT: Positive for congestion and postnasal drip. Negative for ear pain and trouble swallowing.   Eyes: Negative for pain and visual disturbance.  Respiratory: Negative for cough, shortness of breath and wheezing.   Cardiovascular: Negative for chest pain and palpitations.  Gastrointestinal: Negative for nausea, vomiting, abdominal pain, diarrhea, constipation, blood in stool, abdominal distention and rectal pain.  Genitourinary: Negative for dysuria, hematuria and testicular pain.  Musculoskeletal: Negative for arthralgias and joint swelling.  Skin: Negative for rash.  Neurological: Negative for dizziness, syncope and  headaches.  Hematological: Negative for adenopathy.  Psychiatric/Behavioral: Negative for confusion and dysphoric mood.       Objective:   Physical Exam  Constitutional: He is oriented to person, place, and time. He appears well-developed and well-nourished. No distress.  HENT:  Head: Normocephalic and atraumatic.  Right Ear: External ear normal.  Left Ear: External ear normal.  Mouth/Throat: Oropharynx is clear and moist.  Eyes: Conjunctivae and EOM are normal. Pupils are equal, round, and reactive to light.  Neck: Normal range of motion. Neck supple. No thyromegaly present.  Cardiovascular: Normal rate, regular rhythm and normal heart sounds.   No murmur heard. Pulmonary/Chest: No respiratory distress. He has no wheezes. He has no rales.  Abdominal: Soft. Bowel sounds are normal. He exhibits no distension and no mass. There is no tenderness. There is no rebound and no guarding.  Genitourinary: Rectum normal and prostate normal.  Musculoskeletal: He exhibits no edema.  Lymphadenopathy:    He has no cervical adenopathy.  Neurological: He is alert and oriented to person, place, and time. He displays normal reflexes. No cranial nerve deficit.  Skin: No rash noted.  Psychiatric: He has a normal mood and affect.          Assessment & Plan:  Complete physical. Colonoscopy up-to-date. Tetanus up-to-date. Continue yearly flu vaccine. Consider shingles vaccine by age 58.  Chronic cough. Question postnasal drip related. Continue nasal antihistamine and nasal steroid. Prednisone taper starting at 40 mg daily over the next 10 days [place] 2 weeks if cough not resolving. Consider trial of tramadol not improved at that time

## 2013-11-28 ENCOUNTER — Telehealth: Payer: Self-pay | Admitting: Family Medicine

## 2013-11-28 MED ORDER — LEVOFLOXACIN 500 MG PO TABS: 500.0000 mg | ORAL_TABLET | Freq: Every day | ORAL | Status: DC

## 2013-11-28 NOTE — Telephone Encounter (Signed)
Pt states his sinus infection is back. The zpak did not work (taken 10 says ago). Pt states levofloxacin (LEVAQUIN) 500 MG tablet has worked in the past when all else failed. [facility]

## 2013-11-28 NOTE — Telephone Encounter (Signed)
Last seen 10/05/13

## 2013-11-28 NOTE — Telephone Encounter (Signed)
Pt is aware that Rx is ready for pick up at pharmacy

## 2013-11-28 NOTE — Telephone Encounter (Signed)
Refill Levaquin 500 mg po qd for 10 days.

## 2013-12-27 ENCOUNTER — Encounter: Payer: Self-pay | Admitting: Podiatry

## 2013-12-27 ENCOUNTER — Ambulatory Visit (INDEPENDENT_AMBULATORY_CARE_PROVIDER_SITE_OTHER): Payer: BC Managed Care – PPO | Admitting: Podiatry

## 2013-12-27 VITALS — BP 132/81 | HR 66 | Resp 14 | Ht 72.0 in | Wt 177.0 lb

## 2013-12-27 DIAGNOSIS — B351 Tinea unguium: Secondary | ICD-10-CM

## 2013-12-27 NOTE — Progress Notes (Signed)
   Subjective:    Patient ID: Edwin SarnaStuart J [name], male    DOB: 1956-05-23, 58 y.o.   MRN: [id number]  HPI Comments: N toenail problem L right 3, 4,5 th toenails D and O years C thickened, discolored and catch on socks A difficult to clip T history of right 1st toenail removal for fungus by Dr. Leeanne Deeduchman     Review of Systems  HENT: Positive for sinus pressure.   All other systems reviewed and are negative.      Objective:   Physical Exam  Orientated x3 space white male  Vascular: DP and PT pulses 2/4 bilaterally  Neurological: Ankle reflexes equal and reactive bilaterally  Dermatological: Toenails 3, 4, 5 right are brittle, hypertrophic and discolored. The right hallux nail which had previous permanent nail surgery has residual non-dystrophic nail plate  Musculoskeletal: HAV deformity right      Assessment & Plan:   Assessment: Onychomycoses 3, 4, 5 right foot  Plan: Discussed treatment options including no treatment, topical medication, oral medication or permit toenail removal. Patient has interested in permit toenail removal, however, says that he is plan is to the patient's in the next several weeks. I recommend that he for elective phenol matricectomy to-5 right until he returns from vacation.  Toenails 3-5 were debrided without a bleeding.  Reappoint for fetal matricectomy x3 at patient's request

## 2013-12-27 NOTE — Patient Instructions (Signed)
Return when you're schedule permits permanent toenail removal 3, 4, 5 on the right foot

## 2013-12-28 ENCOUNTER — Encounter: Payer: Self-pay | Admitting: Podiatry

## 2014-02-28 ENCOUNTER — Ambulatory Visit: Payer: BC Managed Care – PPO | Admitting: Podiatry

## 2014-04-02 ENCOUNTER — Other Ambulatory Visit: Payer: Self-pay | Admitting: Family Medicine

## 2014-04-02 ENCOUNTER — Ambulatory Visit (INDEPENDENT_AMBULATORY_CARE_PROVIDER_SITE_OTHER): Payer: BC Managed Care – PPO | Admitting: Podiatry

## 2014-04-02 ENCOUNTER — Encounter: Payer: Self-pay | Admitting: Podiatry

## 2014-04-02 VITALS — BP 133/86 | HR 63 | Resp 14 | Ht 72.0 in | Wt 178.0 lb

## 2014-04-02 DIAGNOSIS — L600 Ingrowing nail: Secondary | ICD-10-CM

## 2014-04-02 NOTE — Patient Instructions (Signed)

## 2014-04-03 ENCOUNTER — Encounter: Payer: Self-pay | Admitting: Podiatry

## 2014-04-03 NOTE — Progress Notes (Signed)
Patient ID: Edwin [name], male   DOB: [birth year], 58 y.o.   MRN: [id number]  Subjective: This patient presents today requesting permanent removal of third toenail on the right foot. The nail is sore when walking and wearing shoes. He is requesting that toenails 1,4 and 5 debrided. The right hallux nail has been removed previously.  No change in patient's medical history or medication  Objective:  Orientated x3 white male  Vascular: DP and PT pulses 2/4 bilaterally  Dermatological: Residual right hallux nail noted with spicule Brittle, elongated, hypertrophic toenails 3, 4, 5  Assessment: Partial regrowth the right hallux toenail Symptomatic onychomycoses 3, 4, 5 toenails  Plan: Discussed treatment options with patient including no treatment, oral and topical medication, repetitive debridement. This patient is requesting permanent removal of the third right toenail and debridement of the remaining nails  The third right toe was blocked  with 2 cc of 50-50 mixture of 2% plain Xylocaine and 0.5% plain Marcaine. The toe is pain with Betadine and exsanguinated. The third right toenail was the avulsed and matricectomy performed. An antibiotic dressing was applied. The tourniquet was released and spontaneous capillary filling time noted on the third right toe.  Postoperative oral and written instructions provided. Over-the-counter NSAID recommended for pain control.  Toenails 1,4 and 5 were debrided  Reappoint at patient's request

## 2014-04-26 ENCOUNTER — Ambulatory Visit (INDEPENDENT_AMBULATORY_CARE_PROVIDER_SITE_OTHER): Payer: BC Managed Care – PPO

## 2014-04-26 DIAGNOSIS — Z23 Encounter for immunization: Secondary | ICD-10-CM

## 2014-05-08 ENCOUNTER — Ambulatory Visit (INDEPENDENT_AMBULATORY_CARE_PROVIDER_SITE_OTHER): Payer: BC Managed Care – PPO | Admitting: Family Medicine

## 2014-05-08 ENCOUNTER — Encounter: Payer: Self-pay | Admitting: Family Medicine

## 2014-05-08 VITALS — BP 128/72 | HR 74 | Temp 98.1°F | Wt 187.0 lb

## 2014-05-08 DIAGNOSIS — J0140 Acute pansinusitis, unspecified: Secondary | ICD-10-CM

## 2014-05-08 MED ORDER — LEVOFLOXACIN 500 MG PO TABS: 500.0000 mg | ORAL_TABLET | Freq: Every day | ORAL | Status: DC

## 2014-05-08 NOTE — Progress Notes (Signed)
   Subjective:    Patient ID: Edwin SarnaStuart J [name], male    DOB: 25-Jan-1956, 58 y.o.   MRN: [id number]  Sinusitis Associated symptoms include congestion, coughing, headaches and sinus pressure. Pertinent negatives include no chills.   Patient seen with three-week history of progressive bilateral facial pressure and nasal congestion and headache. He started with cold-like symptoms. He usually has about 2 sinus infections per year and states this is very typical. He has had some greenish phlegm. Has occasional dry cough. He does have perennial allergies but is regularly doing nasal saline irrigation along with combination spray with antihistamine and Flonase. He's also had immunotherapy regularly. He has previously taken Zithromax but thinks he might have developed some resistance. Last 2 courses of antibiotic required transition to Levaquin.  Past Medical History  Diagnosis Date  . Allergy   . Arthritis    Past Surgical History  Procedure Laterality Date  . Knee surgery  2009    microfracture surgery  . Tonsillectomy      reports that he quit smoking about 36 years ago. His smoking use included Cigarettes. He has a 5 pack-year smoking history. He has never used smokeless tobacco. He reports that he drinks about 2 ounces of alcohol per week. He reports that he does not use illicit drugs. family history includes Dementia in his father; Hyperlipidemia in his father; Multiple sclerosis in his brother. No Known Allergies    Review of Systems  Constitutional: Positive for fatigue. Negative for fever and chills.  HENT: Positive for congestion and sinus pressure.   Respiratory: Positive for cough.   Neurological: Positive for headaches.       Objective:   Physical Exam  Constitutional: He appears well-developed and well-nourished.  HENT:  Right Ear: External ear normal.  Left Ear: External ear normal.  Mouth/Throat: Oropharynx is clear and moist.  Nasal mucosa erythematous.  Neck: Neck  supple.  Cardiovascular: Normal rate and regular rhythm.   Pulmonary/Chest: Effort normal and breath sounds normal. No respiratory distress. He has no wheezes. He has no rales.  Lymphadenopathy:    He has no cervical adenopathy.          Assessment & Plan:  Acute pansinusitis following recent viral URI.  Given duration of symptoms and no relief with conservative measures, Levaquin 500 milligrams once daily for 10 days.

## 2014-06-28 ENCOUNTER — Ambulatory Visit (INDEPENDENT_AMBULATORY_CARE_PROVIDER_SITE_OTHER): Payer: BC Managed Care – PPO | Admitting: Family Medicine

## 2014-06-28 ENCOUNTER — Encounter: Payer: Self-pay | Admitting: Family Medicine

## 2014-06-28 VITALS — BP 130/70 | HR 60 | Temp 97.7°F | Wt 189.0 lb

## 2014-06-28 DIAGNOSIS — R05 Cough: Secondary | ICD-10-CM

## 2014-06-28 DIAGNOSIS — R1031 Right lower quadrant pain: Secondary | ICD-10-CM

## 2014-06-28 DIAGNOSIS — R059 Cough, unspecified: Secondary | ICD-10-CM

## 2014-06-28 DIAGNOSIS — R1030 Lower abdominal pain, unspecified: Secondary | ICD-10-CM

## 2014-06-28 MED ORDER — PREDNISONE 10 MG PO TABS: ORAL_TABLET | ORAL | Status: DC

## 2014-06-28 MED ORDER — AZITHROMYCIN 250 MG PO TABS: ORAL_TABLET | ORAL | Status: AC

## 2014-06-28 NOTE — Progress Notes (Signed)
Pre visit review using our clinic review tool, if applicable. No additional management support is needed unless otherwise documented below in the visit note. 

## 2014-06-28 NOTE — Progress Notes (Signed)
   Subjective:    Patient ID: Edwin [name], male    DOB: [birth year], 58 y.o.   MRN: [id number]  HPI  patient seen for the following issues   Right groin pain. Onset about a month ago. He plays tennis frequently but denies specific injury. He has pain only with certain movements especially abduction. He has not noted any lymphadenopathy. Pain is relatively mild. No low back pain. He's taken ibuprofen with minimal relief. He still been able to play tennis and run without difficulty. He's been able to cycle without pain. He is still doing some exercises at the gym with abduction and abduction of the hips with minimal pain   Dry cough for couple weeks. Long history of perennial allergies. He takes nasal steroid and nasal antihistamine regularly along with saline irrigation. Not aware of obvious wheezing. This taken prednisone the past which seemed to help. Cough nonproductive. Never smoked. No appetite or weight changes. No active reflux symptoms.  Past Medical History  Diagnosis Date  . Allergy   . Arthritis    Past Surgical History  Procedure Laterality Date  . Knee surgery  2009    microfracture surgery  . Tonsillectomy      reports that he quit smoking about 36 years ago. His smoking use included Cigarettes. He has a 5 pack-year smoking history. He has never used smokeless tobacco. He reports that he drinks about 2.0 oz of alcohol per week. He reports that he does not use illicit drugs. family history includes Dementia in his father; Hyperlipidemia in his father; Multiple sclerosis in his brother. No Known Allergies    Review of Systems  Constitutional: Negative for fever, chills, appetite change and unexpected weight change.  HENT: Positive for congestion.   Respiratory: Positive for cough. Negative for shortness of breath and wheezing.   Cardiovascular: Negative for chest pain, palpitations and leg swelling.  Musculoskeletal: Negative for back pain and gait problem.    Hematological: Negative for adenopathy.       Objective:   Physical Exam  Constitutional: He appears well-developed and well-nourished. No distress.  Neck: Neck supple.  Cardiovascular: Normal rate and regular rhythm.   Pulmonary/Chest: Effort normal and breath sounds normal. No respiratory distress. He has no wheezes. He has no rales.  Musculoskeletal: He exhibits no edema.  Right hip shows full range of motion. No inguinal adenopathy. No pain with hip flexion. Minimal pain with hip abduction and adduction.  No right lower extremity edema  Neurological:  No weakness right lower extremity          Assessment & Plan:  #1 right groin pain. Suspect groin strain. His exam is relatively unremarkable. We've recommended he avoid exercise involving hip abduction or adduction until this heals. He will continue with cycling and other things that do not seem to exacerbate. We've recommended general stretches and touch base in 2 weeks if not improving #2 persistent cough. Nonfocal exam. History of perennial allergies as above. We agreed to brief taper of prednisone which has worked for him in the past. This may help problem #1 as well.

## 2014-06-28 NOTE — Patient Instructions (Signed)
Groin Strain °A groin strain (also called a groin pull) is an injury to the muscles or tendon on the upper inner part of the thigh. These muscles are called the adductor muscles or groin muscles. They are responsible for moving the leg across the body. A muscle strain occurs when a muscle is overstretched and some muscle fibers are torn. A groin strain can range from mild to severe depending on how many muscle fibers are affected and whether the muscle fibers are partially or completely torn.  °Groin strains usually occur during exercise or participation in sports. The injury often happens when a sudden, violent force is placed on a muscle, stretching the muscle too far. A strain is more likely to occur when your muscles are not warmed up or if you are not properly conditioned. Depending on the severity of the groin strain, recovery time may vary from a few weeks to several weeks. Severe injuries often require 4-6 weeks for recovery. In these cases, complete healing can take 4-5 months.  °CAUSES  °· Stretching the groin muscles too far or too suddenly, often during side-to-side motion with an abrupt change in direction. °· Putting repeated stress on the groin muscles over a long period of time. °· Performing vigorous activity without properly stretching the groin muscles beforehand. °SYMPTOMS  °· Pain and tenderness in the groin area. This begins as sharp pain and persists as a dull ache. °· Popping or snapping feeling when the injury occurs (for severe strains). °· Swelling or bruising. °· Muscle spasms. °· Weakness in the leg. °· Stiffness in the groin area with decreased ability to move the affected muscles. °DIAGNOSIS  °Your caregiver will perform a physical exam to diagnose a groin strain. You will be asked about your symptoms and how the injury occurred. X-rays are sometimes needed to rule out a broken bone or cartilage problems. Your caregiver may order a CT scan or MRI if a complete muscle tear is  suspected. °TREATMENT  °A groin strain will often heal on its own. Your caregiver may prescribe medicines to help manage pain and swelling (anti-inflammatory medicine). You may be told to use crutches for the first few days to minimize your pain. °HOME CARE INSTRUCTIONS  °· Rest. Do not use the strained muscle if it causes pain. °· Put ice on the injured area. °¨ Put ice in a plastic bag. °¨ Place a towel between your skin and the bag. °¨ Leave the ice on for 15-20 minutes, every 2-3 hours. Do this for the first 2 days after the injury.  °· Only take over-the-counter or prescription medicines as directed by your caregiver. °· Wrap the injured area with an elastic bandage as directed by your caregiver. °· Keep the injured leg raised (elevated). °· Walk, stretch, and perform range-of-motion exercises to improve blood flow to the injured area. Only perform these activities if you can do so without any pain. °To prevent muscle strains: °· Warm up before exercise. °· Develop proper conditioning and strength in the groin muscles. °SEEK IMMEDIATE MEDICAL CARE IF:  °· You have increased pain or swelling in the affected area.   °· Your symptoms are not improving or are getting worse. °MAKE SURE YOU:  °· Understand these instructions. °· Will watch your condition. °· Will get help right away if you are not doing well or get worse. °Document Released: 02/25/2004 Document Revised: 06/15/2012 Document Reviewed: 03/02/2012 °[employer]® Patient Information ©2015 [employer]. This information is not intended to replace advice given to you   by your health care provider. Make sure you discuss any questions you have with your health care provider. ° °

## 2014-07-13 DIAGNOSIS — I2510 Atherosclerotic heart disease of native coronary artery without angina pectoris: Secondary | ICD-10-CM

## 2014-07-13 HISTORY — DX: Atherosclerotic heart disease of native coronary artery without angina pectoris: I25.10

## 2014-09-27 ENCOUNTER — Telehealth: Payer: Self-pay | Admitting: Family Medicine

## 2014-09-27 ENCOUNTER — Encounter: Payer: Self-pay | Admitting: Family Medicine

## 2014-09-27 ENCOUNTER — Ambulatory Visit (INDEPENDENT_AMBULATORY_CARE_PROVIDER_SITE_OTHER): Payer: BC Managed Care – PPO | Admitting: Family Medicine

## 2014-09-27 VITALS — BP 128/80 | HR 60 | Temp 98.4°F | Wt 190.0 lb

## 2014-09-27 DIAGNOSIS — R1030 Lower abdominal pain, unspecified: Secondary | ICD-10-CM

## 2014-09-27 DIAGNOSIS — Z0000 Encounter for general adult medical examination without abnormal findings: Secondary | ICD-10-CM

## 2014-09-27 DIAGNOSIS — R079 Chest pain, unspecified: Secondary | ICD-10-CM

## 2014-09-27 DIAGNOSIS — R1031 Right lower quadrant pain: Secondary | ICD-10-CM

## 2014-09-27 DIAGNOSIS — R0789 Other chest pain: Secondary | ICD-10-CM

## 2014-09-27 LAB — COMPREHENSIVE METABOLIC PANEL
ALBUMIN: 4.5 g/dL (ref 3.5–5.2)
ALT: 18 U/L (ref 0–53)
AST: 22 U/L (ref 0–37)
Alkaline Phosphatase: 67 U/L (ref 39–117)
BILIRUBIN TOTAL: 0.8 mg/dL (ref 0.2–1.2)
BUN: 16 mg/dL (ref 6–23)
CO2: 32 meq/L (ref 19–32)
Calcium: 9.2 mg/dL (ref 8.4–10.5)
Chloride: 105 mEq/L (ref 96–112)
Creatinine, Ser: 1.03 mg/dL (ref 0.40–1.50)
GFR: 78.39 mL/min (ref >60.00)
GLUCOSE: 93 mg/dL (ref 70–99)
POTASSIUM: 4.5 meq/L (ref 3.5–5.1)
SODIUM: 141 meq/L (ref 135–145)
TOTAL PROTEIN: 6.3 g/dL (ref 6.0–8.3)

## 2014-09-27 LAB — LIPID PANEL
CHOL/HDL RATIO: 4
Cholesterol: 226 mg/dL — ABNORMAL HIGH (ref 0–200)
HDL: 55.7 mg/dL (ref >39.00)
LDL Cholesterol: 154 mg/dL — ABNORMAL HIGH (ref 0–99)
NONHDL: 170.3
Triglycerides: 81 mg/dL (ref 0.0–149.0)
VLDL: 16.2 mg/dL (ref 0.0–40.0)

## 2014-09-27 LAB — CBC WITH DIFFERENTIAL/PLATELET
Basophils Absolute: 0 10*3/uL (ref 0.0–0.1)
Basophils Relative: 0.3 % (ref 0.0–3.0)
EOS ABS: 0.3 10*3/uL (ref 0.0–0.7)
Eosinophils Relative: 6.2 % — ABNORMAL HIGH (ref 0.0–5.0)
HEMATOCRIT: 46 % (ref 39.0–52.0)
Hemoglobin: 16.1 g/dL (ref 13.0–17.0)
LYMPHS PCT: 22.4 % (ref 12.0–46.0)
Lymphs Abs: 1 10*3/uL (ref 0.7–4.0)
MCHC: 34.9 g/dL (ref 30.0–36.0)
MCV: 98.1 fl (ref 78.0–100.0)
MONO ABS: 0.3 10*3/uL (ref 0.1–1.0)
Monocytes Relative: 7.4 % (ref 3.0–12.0)
NEUTROS PCT: 63.7 % (ref 43.0–77.0)
Neutro Abs: 2.9 10*3/uL (ref 1.4–7.7)
PLATELETS: 165 10*3/uL (ref 150.0–400.0)
RBC: 4.69 Mil/uL (ref 4.22–5.81)
RDW: 12.8 % (ref 11.5–15.5)
WBC: 4.6 10*3/uL (ref 4.0–10.5)

## 2014-09-27 LAB — PSA: PSA: 0.73 ng/mL (ref 0.10–4.00)

## 2014-09-27 LAB — TSH: TSH: 1.7 u[IU]/mL (ref 0.35–4.50)

## 2014-09-27 NOTE — Telephone Encounter (Signed)
I dont see that the wife is on the patient DPR.

## 2014-09-27 NOTE — Telephone Encounter (Signed)
Patient had an May appt for a Stress Test and was moved up to 10/03/14. Wife is still not happy with this time frame. She is currently in [place] and would like to talk with Dr Caryl NeverBurchette (said he was supposed to call her at lunch per her husband).

## 2014-09-27 NOTE — Progress Notes (Signed)
   Subjective:    Patient ID: Edwin SarnaStuart J [name], male    DOB: [birth year], 59 y.o.   MRN: [id number]  HPI Patient seen for several issues as follows:  One-week history of somewhat of a burning type discomfort in his left axillary and left chest region. No shortness of breath. No exertional symptoms. Denies any cough or pleuritic pain. No fevers or chills. Pain is relatively constant. Moderate severity. No exacerbating or alleviating factors. Patient concerned because he had a brother just recently diagnosed with coronary disease at age 60. He also has a older brother who has history of coronary disease and uncle who died age 60 of CAD. Patient is nonsmoker. He has no cardiac history.  Patient also has persistent pain right groin region. No history of injury. Denies radiculopathy symptoms. No recent hernia and has not noted any bulges. No dysuria. No skin rashes.  Past Medical History  Diagnosis Date  . Allergy   . Arthritis    Past Surgical History  Procedure Laterality Date  . Knee surgery  2009    microfracture surgery  . Tonsillectomy      reports that he quit smoking about 37 years ago. His smoking use included Cigarettes. He has a 5 pack-year smoking history. He has never used smokeless tobacco. He reports that he drinks about 2.0 oz of alcohol per week. He reports that he does not use illicit drugs. family history includes Dementia in his father; Hyperlipidemia in his father; Multiple sclerosis in his brother. No Known Allergies    Review of Systems  Constitutional: Negative for fatigue.  Eyes: Negative for visual disturbance.  Respiratory: Negative for cough, chest tightness and shortness of breath.   Cardiovascular: Negative for palpitations and leg swelling.  Genitourinary: Negative for dysuria.  Musculoskeletal: Negative for back pain.  Neurological: Negative for dizziness, syncope, weakness, light-headedness and headaches.       Objective:   Physical Exam    Constitutional: He appears well-developed and well-nourished. No distress.  Neck: Neck supple. No thyromegaly present.  Cardiovascular: Normal rate and regular rhythm.   Pulmonary/Chest: Effort normal and breath sounds normal. No respiratory distress. He has no wheezes. He has no rales.  Abdominal: Soft. He exhibits no mass. There is no tenderness.  Musculoskeletal: He exhibits no edema.  Lymphadenopathy:    He has no cervical adenopathy.  Skin: No rash noted.          Assessment & Plan:  #1 atypical chest pain. Burning quality suggests possible neuropathic though this does not appear to be following a dermatome distribution and no evidence for skin rash. Doubt cardiac origin but patient has significant concerns because of family history. He is requesting stress test. Start with EKG. Set up exercise tolerance test. Clinically, this does not sound suspicious for pulmonary embolus but patient had some concerns. Will check d-dimer. #2 persistent right groin pain. Set up sports medicine evaluation

## 2014-09-27 NOTE — Progress Notes (Signed)
Pre visit review using our clinic review tool, if applicable. No additional management support is needed unless otherwise documented below in the visit note. 

## 2014-09-27 NOTE — Telephone Encounter (Signed)
Wife wants to talk with you urgently please re: getting his Stress Test test/CT angio sooner than May.

## 2014-09-27 NOTE — Telephone Encounter (Signed)
[facility]

## 2014-09-28 ENCOUNTER — Telehealth (HOSPITAL_COMMUNITY): Payer: Self-pay

## 2014-09-28 ENCOUNTER — Telehealth: Payer: Self-pay | Admitting: Family Medicine

## 2014-09-28 LAB — D-DIMER, QUANTITATIVE (NOT AT ARMC): D DIMER QUANT: 0.27 ug{FEU}/mL (ref 0.00–0.48)

## 2014-09-28 NOTE — Telephone Encounter (Signed)
Pt would like his DDimer results please.

## 2014-09-28 NOTE — Telephone Encounter (Signed)
Encounter complete. 

## 2014-09-30 ENCOUNTER — Encounter: Payer: Self-pay | Admitting: Family Medicine

## 2014-10-01 NOTE — Telephone Encounter (Signed)
Pt sent a message through mychart. Informed patient results through mychart.

## 2014-10-02 ENCOUNTER — Other Ambulatory Visit: Payer: Self-pay

## 2014-10-03 ENCOUNTER — Ambulatory Visit (HOSPITAL_COMMUNITY)
Admission: RE | Admit: 2014-10-03 | Discharge: 2014-10-03 | Disposition: A | Discharge status: Home / Self Care | Payer: BC Managed Care – PPO | Source: Ambulatory Visit | Attending: Family Medicine | Admitting: Family Medicine

## 2014-10-03 DIAGNOSIS — R079 Chest pain, unspecified: Secondary | ICD-10-CM | POA: Insufficient documentation

## 2014-10-03 DIAGNOSIS — R0789 Other chest pain: Secondary | ICD-10-CM

## 2014-10-03 NOTE — Procedures (Signed)
Exercise Treadmill Test  Pre-Exercise Testing Evaluation Rhythm: normal sinus                  Test  Exercise Tolerance Test Ordering MD: Evelena PeatBruce Burchette, MD  Interpreting MD:   Unique Test No: 1 Treadmill:  1  Indication for ETT: chest pain - rule out ischemia  Contraindication to ETT: No   Stress Modality: exercise - treadmill  Cardiac Imaging Performed: non   Protocol: standard Bruce - maximal  Max BP:  163/77  Max MPHR (bpm):  161 85% MPR (bpm):  136  MPHR obtained (bpm):  164 % MPHR obtained:  101  Reached 85% MPHR (min:sec):  9:30 Total Exercise Time (min-sec):  14:00  Workload in METS:  17.20 Borg Scale:   Reason ETT Terminated:  dyspnea, fatigue    ST Segment Analysis At Rest: normal ST segments - no evidence of significant ST depression With Exercise: no evidence of significant ST depression  Other Information Arrhythmia:  No Angina during ETT:  absent (0) Quality of ETT:  diagnostic  ETT Interpretation:  normal - no evidence of ischemia by ST analysis  Comments: Nl GXT. The patient had no ischemic changes but became bradycardic and hypotensive post exercise ( exercised 14 min to 17 mets!!!). No CP. Technically nl GXT w/o ischemic changes  Recommendations: Follow up with Dr. Caryl NeverBurchette

## 2014-10-04 ENCOUNTER — Encounter: Payer: Self-pay | Admitting: Sports Medicine

## 2014-10-04 ENCOUNTER — Ambulatory Visit
Admission: RE | Admit: 2014-10-04 | Discharge: 2014-10-04 | Disposition: A | Discharge status: Home / Self Care | Payer: BC Managed Care – PPO | Source: Ambulatory Visit | Attending: Sports Medicine | Admitting: Sports Medicine

## 2014-10-04 ENCOUNTER — Telehealth: Payer: Self-pay | Admitting: Family Medicine

## 2014-10-04 ENCOUNTER — Ambulatory Visit (INDEPENDENT_AMBULATORY_CARE_PROVIDER_SITE_OTHER): Payer: BC Managed Care – PPO | Admitting: Sports Medicine

## 2014-10-04 VITALS — BP 136/82 | HR 62 | Ht 72.0 in | Wt 185.0 lb

## 2014-10-04 DIAGNOSIS — G8929 Other chronic pain: Secondary | ICD-10-CM | POA: Insufficient documentation

## 2014-10-04 DIAGNOSIS — R1030 Lower abdominal pain, unspecified: Secondary | ICD-10-CM | POA: Diagnosis not present

## 2014-10-04 DIAGNOSIS — R1031 Right lower quadrant pain: Secondary | ICD-10-CM

## 2014-10-04 DIAGNOSIS — M25551 Pain in right hip: Secondary | ICD-10-CM

## 2014-10-04 NOTE — Telephone Encounter (Signed)
Pt would like results of stress test 

## 2014-10-04 NOTE — Progress Notes (Signed)
Edwin Edwin [name] - 59 y.o. male MRN [id number]  Date of birth: [birth year]  SUBJECTIVE:  Including CC & ROS.  Patient is a 59 year old new patient to our office presented today with chronic right hip pain/groin pain that has been present for approximately 3 months. Patient denies any specific injury or trauma that provoked his pain. Reports that he regularly does some resistance training and biking at the gym with no provoking injury. He reports majority of pain with hyperflexion and external rotation with hip such as when tying his shoes. He describes the pain as dull with nature with occasional sharp pain in specific positions. Reports good strength in his hip muscles and often will do abduction and abduction workout machines with minimal pain and no sensation of pulling. He used to be an avid marathon runner many years ago but discontinued because of arthritis in his left knee that resulted in arthroscopic surgery with microfracture and meniscectomy proximally 5 years ago. Patient has not been taking any medication for his pain other than intermittent ibuprofen.   ROS: Review of systems otherwise negative except for information present in HPI  HISTORY: Past Medical, Surgical, Social, and Family History Reviewed & Updated per EMR. Pertinent Historical Findings include: Recent intermittent history of chest pain being evaluated by stress test but has not heard the results. Other than his left knee arthroscopic surgery no other surgeries Intermittent allergic rhinitis No family history of cardiac disease  DATA REVIEWED: No other imaging of the hip or back available  PHYSICAL EXAM:  VS: BP:136/82 mmHg  HR:62bpm  TEMP: ( )  RESP:   HT:6' (182.9 cm)   WT:185 lb (83.915 kg)  BMI:25.1 RIGHT HIP EXAM:  General: well nourished Skin of LE: warm; dry, no rashes, lesions, ecchymosis or erythema. Vascular: Dorsal pedal and femoral pulses 2+ bilaterally Neurologically: Sensation to light touch lower  extremities equal and intact bilaterally.    Observation - no ecchymosis, edema, or hematoma present over the anterior, lateral, or posterior soft tissue surrounding the hip Palpation:  Mid anterior hip joint tenderness some over the adductor insertion No tenderness over the pubic synthesis,  No tenderness of the the ASIS, no pain over the iliac crest,  No tenderness of the lateral greater trochanter or bursa, NWG:[id number]:Moderate Pain with hip internal rotation with slight decrease on the right compared to left Moderate pain with hip external rotation symmetric bilaterally. No pain with hip flexion extension Muscle strength: Hip flexor 5/5 bilaterally, hip extension 5/5 laterally, hip abduction 5/5 bilaterally, hip abduction 5/5 bilaterally no increased pain with testing. No increased pain with hip abduction/slight flexion. No pain or weakness with gluteus medius and minimus hip abduction, abdominal muscle contraction forward or oblique positions.   ASSESSMENT & PLAN: See problem based charting & AVS for pt instructions. Impression: Suspected right hip pain secondary to degenerative changes of arthritis and degenerative labral tear.  Recommendations: -Given the patient's history of no provoking injury of muscle strain or stress, I do not suspect this is an abductor muscular tendinitis or strain at this time. -However given his history of a long-standing running with a gradual onset of pain that he describes as dull in nature deep in the hip more concerning for degenerative changes causing his pain. -We'll start with a x-ray of the pelvis right hip to evaluate degree of degenerative changes. -Based on x-ray results will further recommend following up for ultrasound exam consideration of intra-articular injection is moderate to severe degenerative changes are present. -If only mild  arthritis is present would consider ultrasound examination of the abductors and possible referral for MRI. -Patient was  agreeable this plan will follow-up with him over the phone once x-ray results are available.

## 2014-10-04 NOTE — Telephone Encounter (Signed)
Results in chart and also in your folder

## 2014-10-07 NOTE — Telephone Encounter (Signed)
Pt has been notified of normal stress test results.

## 2014-10-09 ENCOUNTER — Ambulatory Visit (INDEPENDENT_AMBULATORY_CARE_PROVIDER_SITE_OTHER): Payer: BC Managed Care – PPO | Admitting: Sports Medicine

## 2014-10-09 ENCOUNTER — Ambulatory Visit (INDEPENDENT_AMBULATORY_CARE_PROVIDER_SITE_OTHER): Payer: BC Managed Care – PPO | Admitting: Family Medicine

## 2014-10-09 ENCOUNTER — Encounter: Payer: Self-pay | Admitting: Family Medicine

## 2014-10-09 ENCOUNTER — Encounter: Payer: Self-pay | Admitting: Sports Medicine

## 2014-10-09 ENCOUNTER — Ambulatory Visit (INDEPENDENT_AMBULATORY_CARE_PROVIDER_SITE_OTHER)
Admission: RE | Admit: 2014-10-09 | Discharge: 2014-10-09 | Disposition: A | Discharge status: Home / Self Care | Payer: BC Managed Care – PPO | Source: Ambulatory Visit | Attending: Family Medicine | Admitting: Family Medicine

## 2014-10-09 VITALS — BP 121/67 | HR 70 | Ht 70.0 in | Wt 190.0 lb

## 2014-10-09 VITALS — BP 130/70 | HR 62 | Temp 98.3°F | Ht 70.0 in | Wt 190.0 lb

## 2014-10-09 DIAGNOSIS — Z0000 Encounter for general adult medical examination without abnormal findings: Secondary | ICD-10-CM

## 2014-10-09 DIAGNOSIS — M1611 Unilateral primary osteoarthritis, right hip: Secondary | ICD-10-CM

## 2014-10-09 NOTE — Progress Notes (Signed)
Pre visit review using our clinic review tool, if applicable. No additional management support is needed unless otherwise documented below in the visit note. 

## 2014-10-09 NOTE — Progress Notes (Signed)
Edwin [name] - 59 y.o. male MRN [id number]  Date of birth: 05/30/1956  SUBJECTIVE:  Including CC & ROS.  Edwin [name] is a pleasant 59 year old male presents today for follow-up chronic right hip pain and groin pain that has been intermittent for over 3 months. -Patient's last visit we discussed that his pain was intermittent worse with particular activities such as hyperextension of the hip, external rotation and internal rotation at end ranges. -Patient had no injury or traumatic pathology. -He was a avid runner for many years which could have provoked arthritic type pain. -Patient was sent for hip x-rays that showed moderate degenerative changes in the hip both with cam and pincer impingement as well as cystic changes to the acetabulum. -Patient reports today that he has not taken any medication for his pain. Most of his pain is a sharp pain with significant and ranges of motion. Mostly tolerable and is not debilitating and he is able to do tennis several days a week with no difficulty.   ROS: Review of systems otherwise negative except for information present in HPI  HISTORY: Past Medical, Surgical, Social, and Family History Reviewed & Updated per EMR. Pertinent Historical Findings include: Recent intermittent history of chest pain being evaluated by stress test but has not heard the results. Other than his left knee arthroscopic surgery no other surgeries Intermittent allergic rhinitis No family history of cardiac disease  DATA REVIEWED: No other imaging of the hip or back available  PHYSICAL EXAM:  VS: BP:121/67 mmHg  HR:70bpm  TEMP: ( )  RESP:   HT:5\' 10"  (177.8 cm)   WT:190 lb (86.183 kg)  BMI:27.3 RIGHT HIP EXAM:  General: well nourished Skin of LE: warm; dry, no rashes, lesions, ecchymosis or erythema. Vascular: Dorsal pedal and femoral pulses 2+ bilaterally Neurologically: Sensation to light touch lower extremities equal and intact bilaterally.    Observation - no  ecchymosis, edema, or hematoma present over the anterior, lateral, or posterior soft tissue surrounding the hip Palpation:  Mid anterior hip joint tenderness some over the adductor insertion No tenderness over the pubic synthesis,  No tenderness of the the ASIS, no pain over the iliac crest,  No tenderness of the lateral greater trochanter or bursa, WUJ:[id number]:Moderate Pain with hip internal rotation with slight decrease on the right compared to left Moderate pain with hip external rotation symmetric bilaterally. No pain with hip flexion extension Muscle strength: Hip flexor 5/5 bilaterally, hip extension 5/5 laterally, hip abduction 5/5 bilaterally, hip abduction 5/5 bilaterally no increased pain with testing. No increased pain with hip abduction/slight flexion. No pain or weakness with gluteus medius and minimus hip abduction, abdominal muscle contraction forward or oblique positions.   ASSESSMENT & PLAN: See problem based charting & AVS for pt instructions. Impression: Suspected right hip pain secondary to degenerative changes of arthritis and degenerative labral tear.  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 -Because his x-rays and physical exam is most concerning for arthritis I suspect this is the source of his pain. -Discussed in length patient's treatment options including continued conservative management with intermittent medication, therapeutic exercise to strengthen pelvic muscle girdle, and even ultrasound guided hip injection. -Patient feels that his pain is intermittent at this time and not warranting management of injection at this time. He will consider injection therapy in the future of this pain becomes more  constant/regular. -Discuss surgical options that are available to him so the patient is  educated about his treatment possibilities. -Patient will follow-up when necessary. Provided with home exercises to work on strengthening. Encouraged biking and elliptical and swimming versus running. Advised that he should minimize tennis as this can contribute

## 2014-10-09 NOTE — Progress Notes (Signed)
   Subjective:    Patient ID: Edwin [name], male    DOB: 11-09-1955, 59 y.o.   MRN: [id number]  HPI Here for complete physical. Generally very healthy. He plays tennis regularly. He had some recent atypical chest pain. Stress test was normal. Got up to 17 mets. No exertional symptoms. He has pending appointment with cardiology in April. His symptoms are somewhat improved. Had atypical burning type symptoms left chest/ left axillary region. No pleuritic pain. No dyspnea. No cough. Patient requesting chest x-ray. Nonsmoker. No recent appetite or weight changes. No adenopathy.  Had colonoscopy age 59 reportedly normal. Tetanus up-to-date. Recent issues with progressive right groin pain. X-rays revealed fairly advanced degenerative changes. Followed by sports medicine.  Past Medical History  Diagnosis Date  . Allergy   . Arthritis    Past Surgical History  Procedure Laterality Date  . Knee surgery  2009    microfracture surgery  . Tonsillectomy      reports that he quit smoking about 37 years ago. His smoking use included Cigarettes. He has a 5 pack-year smoking history. He has never used smokeless tobacco. He reports that he drinks about 2.0 oz of alcohol per week. He reports that he does not use illicit drugs. family history includes Dementia in his father; Hyperlipidemia in his father; Multiple sclerosis in his brother. No Known Allergies    Review of Systems  Constitutional: Negative for appetite change, fatigue and unexpected weight change.  Eyes: Negative for visual disturbance.  Respiratory: Negative for cough, chest tightness and shortness of breath.   Cardiovascular: Negative for palpitations and leg swelling.  Gastrointestinal: Negative for abdominal pain.  Genitourinary: Negative for dysuria.  Skin: Negative for rash.  Neurological: Negative for dizziness, syncope, weakness, light-headedness and headaches.  Hematological: Negative for adenopathy.    Psychiatric/Behavioral: Negative for dysphoric mood.       Objective:   Physical Exam  Constitutional: He is oriented to person, place, and time. He appears well-developed and well-nourished. No distress.  HENT:  Head: Normocephalic and atraumatic.  Right Ear: External ear normal.  Left Ear: External ear normal.  Mouth/Throat: Oropharynx is clear and moist.  Eyes: Conjunctivae and EOM are normal. Pupils are equal, round, and reactive to light.  Neck: Normal range of motion. Neck supple. No thyromegaly present.  Cardiovascular: Normal rate, regular rhythm and normal heart sounds.   No murmur heard. Pulmonary/Chest: No respiratory distress. He has no wheezes. He has no rales.  Abdominal: Soft. Bowel sounds are normal. He exhibits no distension and no mass. There is no tenderness. There is no rebound and no guarding.  Genitourinary: Rectum normal and prostate normal.  Musculoskeletal: He exhibits no edema.  Lymphadenopathy:    He has no cervical adenopathy.  Neurological: He is alert and oriented to person, place, and time. He displays normal reflexes. No cranial nerve deficit.  Skin: No rash noted.  Psychiatric: He has a normal mood and affect.          Assessment & Plan:  Complete physical. Labs reviewed. He has modestly elevated lipids. 10 year risk of CAD event 9%. Patient declines statin at this time after discussion of risk and benefit. He continues to have some mild atypical left chest symptoms. Recent stress test normal. Check chest x-ray. Cardiology follow-up pending. Confirm date of last colonoscopy

## 2014-10-15 ENCOUNTER — Telehealth: Payer: Self-pay | Admitting: Family Medicine

## 2014-10-15 NOTE — Telephone Encounter (Signed)
Left message for patient to return call.

## 2014-10-15 NOTE — Telephone Encounter (Signed)
Please let him know I have called Dr Antoine PocheHochrein twice but have not heard back yet.  Can we check with cardiology office to see if they can move up any?

## 2014-10-15 NOTE — Telephone Encounter (Signed)
Pt called to say that Dr Caryl NeverBurchette was going to [facility] see if they could move his appt up. Pt has an appt with them on 11/01/14

## 2014-10-16 ENCOUNTER — Encounter: Payer: Self-pay | Admitting: Cardiology

## 2014-10-16 ENCOUNTER — Telehealth: Payer: Self-pay | Admitting: Cardiology

## 2014-10-16 ENCOUNTER — Ambulatory Visit (INDEPENDENT_AMBULATORY_CARE_PROVIDER_SITE_OTHER): Payer: BC Managed Care – PPO | Admitting: Cardiology

## 2014-10-16 ENCOUNTER — Telehealth: Payer: Self-pay

## 2014-10-16 VITALS — BP 142/92 | HR 88 | Ht 72.0 in | Wt 186.1 lb

## 2014-10-16 DIAGNOSIS — R079 Chest pain, unspecified: Secondary | ICD-10-CM

## 2014-10-16 NOTE — Telephone Encounter (Signed)
Patient called I received a message from our scheduler a new patient appointment scheduled with Dr.Hochrein 11/01/14 for chest pain.Patient stated he is having a dull ache in left chest radiates under left arm.Stated started as a burning sensation left chest.Stated he had a normal stress test,EKG,D-Dimer but continues to have a dull ache in left chest.No pain at present.Advised I will have scheduler put you on a cancellation list.Advised to go to ER if needed.  [facility].He stated he saw patient for atypical chest pain with burning sensation under left arm.Stated he ordered stress test which he exercised for 17 mets and was GXT normal.Stated D-Dimer normal.Stated patient has increased anxiety due to his brother who recently had coronary stent,and his other brother who is a Dr.advised he needs to have a CT Angiogram as soon as possible.Also wife wants him to have something done.Stated he is ok with 11/01/14 appointment with Dr.Hochrein.Stated he wanted to let Dr.Hochrein know.Advised I just spoke with patient and advised he will be placed on a cancellation list.Patient was advised to go to ER if needed.Dr.Hochrein will be in office this afternoon I will make him aware.Stated he can call him back at [phone].

## 2014-10-16 NOTE — Telephone Encounter (Signed)
See previous 10/16/14 note.

## 2014-10-16 NOTE — Progress Notes (Addendum)
Cardiology Office Note   Date:  10/16/2014   ID:  MANSOUR BALBOA, DOB [birth year], MRN [id number]  PCP:  Kristian Covey, MD  Cardiologist:   Rollene Rotunda, MD   Chief Complaint  Patient presents with  . Chest Pain      History of Present Illness: Edwin [name] is a 59 y.o. male who presents for for evaluation of chest pain. He says this has been going on for a month. He points to his left chest axillary line describes a fist sized discomfort. It has been constant for about a month and was a sharp burning discomfort for the first 2 weeks. Now it is a dull 4 out of 10 discomfort. There is no radiation. It is not positional. It has not been improved with ibuprofen. He has played tennis and does not get worse. He denies any associated symptoms such as nausea vomiting or diaphoresis. He has had no palpitations, presyncope or syncope. He has had a chest x-ray which was unremarkable. EKG demonstrated left axis with an incomplete right bundle branch block. A d-dimer was negative. He did have an exercise treadmill test and went over 14 minutes without exacerbating his symptoms or having any ischemic changes although he did drop his blood pressure post exercise. He otherwise has no past history of cardiac disease and has been an avid runner in the past until he's had some joint problems. He does have a family history of obstructive coronary disease with a younger brother having stents 3. He also has some dyslipidemia.    Past Medical History  Diagnosis Date  . Allergy   . Arthritis     Past Surgical History  Procedure Laterality Date  . Knee surgery  2009    microfracture surgery  . Tonsillectomy       Current Outpatient Prescriptions  Medication Sig Dispense Refill  . azelastine (ASTELIN) 0.1 % nasal spray Place 1 spray into both nostrils at bedtime.     . fluticasone (FLONASE) 50 MCG/ACT nasal spray Place 1 spray into both nostrils daily.     Marland Kitchen ibuprofen (ADVIL,MOTRIN) 200  MG tablet Take 200 mg by mouth every 6 (six) hours as needed.    . Multiple Vitamins-Minerals (MULTIVITAMIN WITH MINERALS) tablet Take 1 tablet by mouth daily.      Marland Kitchen VIAGRA 100 MG tablet TAKE 1/2 TO 1 TABLET BY MOUTH DAILY AS NEEDED FOR ERECTILE DYSFUNCTION 60 tablet 1   No current facility-administered medications for this visit.    Allergies:   Review of patient's allergies indicates no known allergies.    Social History:  The patient  reports that he quit smoking about 37 years ago. His smoking use included Cigarettes. He has a 5 pack-year smoking history. He has never used smokeless tobacco. He reports that he drinks about 2.0 oz of alcohol per week. He reports that he does not use illicit drugs.   Family History:  The patient's family history includes CAD in his brother; CAD (age of onset: 20) in his paternal uncle; CAD (age of onset: 54) in his brother; Dementia in his father; Hyperlipidemia in his father; Multiple sclerosis in his brother.    ROS:  Please see the history of present illness.   Otherwise, review of systems are positive for hip pain.   All other systems are reviewed and negative.    PHYSICAL EXAM: VS:  BP 142/92 mmHg  Pulse 88  Ht 6' (1.829 m)  Wt 186 lb 1.6 oz (84.414  kg)  BMI 25.23 kg/m2 , BMI Body mass index is 25.23 kg/(m^2). GENERAL:  Well appearing HEENT:  Pupils equal round and reactive, fundi not visualized, oral mucosa unremarkable NECK:  No jugular venous distention, waveform within normal limits, carotid upstroke brisk and symmetric, no bruits, no thyromegaly LYMPHATICS:  No cervical, inguinal adenopathy LUNGS:  Clear to auscultation bilaterally BACK:  No CVA tenderness CHEST:  Unremarkable HEART:  PMI not displaced or sustained,S1 and S2 within normal limits, no S3, no S4, no clicks, no rubs, no murmurs ABD:  Flat, positive bowel sounds normal in frequency in pitch, no bruits, no rebound, no guarding, no midline pulsatile mass, no hepatomegaly, no  splenomegaly EXT:  2 plus pulses throughout, no edema, no cyanosis no clubbing SKIN:  No rashes no nodules NEURO:  Cranial nerves II through XII grossly intact, motor grossly intact throughout PSYCH:  Cognitively intact, oriented to person place and time    EKG:  EKG is not ordered today. The ekg ordered 3/17 demonstrates sinus rhythm, incomplete right bundle branch block, left axis deviation, no acute ST-T wave changes.   Recent Labs: 09/27/2014: ALT 18; BUN 16; Creatinine 1.03; Hemoglobin 16.1; Platelets 165.0; Potassium 4.5; Sodium 141; TSH 1.70    Lipid Panel    Component Value Date/Time   CHOL 226* 09/27/2014 1038   TRIG 81.0 09/27/2014 1038   HDL 55.70 09/27/2014 1038   CHOLHDL 4 09/27/2014 1038   VLDL 16.2 09/27/2014 1038   LDLCALC 154* 09/27/2014 1038   LDLDIRECT 143.8 09/20/2012 0901      Wt Readings from Last 3 Encounters:  10/16/14 186 lb 1.6 oz (84.414 kg)  10/09/14 190 lb (86.183 kg)  10/09/14 190 lb (86.183 kg)      Other studies Reviewed: Additional studies/ records that were reviewed today include: POET (Plain Old Exercise Treadmill), EKG. Review of the above records demonstrates:  Please see elsewhere in the note.     ASSESSMENT AND PLAN:  CHEST PAIN:  His chest pain has typical and atypical features.  However, it is ongoing and progressing.  I think that the results of his stress test are reassuring.  However, I would like to schedule a coronary calcium score.  Further studies will be based on this result.    DYSLIPIDEMIA:    He has not wanted to be on multiple medications.  I will discuss this further with him after the coronary calcium score.    Current medicines are reviewed at length with the patient today.  The patient does not have concerns regarding medicines.  The following changes have been made:  no change  Labs/ tests ordered today include: echo, calcium score  Orders Placed This Encounter  Procedures  . CT Cardiac Scoring   Lab  Results  Component Value Date   CHOL 226* 09/27/2014   TRIG 81.0 09/27/2014   HDL 55.70 09/27/2014   LDLCALC 154* 09/27/2014   LDLDIRECT 143.8 09/20/2012     Disposition:   FU with me in two weeks.     Signed, Rollene RotundaJames Colen Eltzroth, MD  10/16/2014 6:02 PM     had anything and [facility]

## 2014-10-16 NOTE — Patient Instructions (Addendum)
Your physician recommends that you schedule a follow-up appointment in: 2 WEEKS [facility]  Your physician has requested that you have cardiac CT. Cardiac computed tomography (CT) is a painless test that uses an x-ray machine to take clear, detailed pictures of your heart. For further information please visit [email or web address]. Please follow instruction sheet as given.[facility]  Your physician has requested that you have an echocardiogram. Echocardiography is a painless test that uses sound waves to create images of your heart. It provides your doctor with information about the size and shape of your heart and how well your heart's chambers and valves are working. This procedure takes approximately one hour. There are no restrictions for this procedure.[facility]

## 2014-10-16 NOTE — Telephone Encounter (Signed)
Pt informed. Informed to call the office back to get on the cancellation list.

## 2014-10-16 NOTE — Telephone Encounter (Signed)
Returned call to patient spoke to Dr.Hochrein he advised he will work in patient this afternoon at 4:45 pm.Advised to be here at 4:30 pm to fill out new patient paperwork.Advised be prepared to wait since he is being worked in.Advised Dr.Hochrein is glad to see you.Dr.Burchett called and was told Dr.Hochrein working in patient this afternoon at 4:45 pm.

## 2014-10-17 ENCOUNTER — Ambulatory Visit: Payer: BC Managed Care – PPO | Admitting: Cardiology

## 2014-10-17 DIAGNOSIS — R079 Chest pain, unspecified: Secondary | ICD-10-CM | POA: Insufficient documentation

## 2014-10-22 ENCOUNTER — Ambulatory Visit (HOSPITAL_COMMUNITY): Payer: BC Managed Care – PPO | Attending: Cardiology | Admitting: Cardiology

## 2014-10-22 ENCOUNTER — Ambulatory Visit (INDEPENDENT_AMBULATORY_CARE_PROVIDER_SITE_OTHER)
Admission: RE | Admit: 2014-10-22 | Discharge: 2014-10-22 | Disposition: A | Discharge status: Home / Self Care | Payer: BC Managed Care – PPO | Source: Ambulatory Visit | Attending: Cardiology | Admitting: Cardiology

## 2014-10-22 DIAGNOSIS — R079 Chest pain, unspecified: Secondary | ICD-10-CM | POA: Diagnosis present

## 2014-10-22 DIAGNOSIS — I340 Nonrheumatic mitral (valve) insufficiency: Secondary | ICD-10-CM | POA: Diagnosis not present

## 2014-10-22 NOTE — Progress Notes (Signed)
Echo performed. 

## 2014-10-29 ENCOUNTER — Encounter: Payer: BC Managed Care – PPO | Admitting: Physician Assistant

## 2014-11-01 ENCOUNTER — Ambulatory Visit: Payer: BC Managed Care – PPO | Admitting: Cardiology

## 2014-11-09 ENCOUNTER — Ambulatory Visit: Payer: BC Managed Care – PPO | Admitting: Cardiology

## 2014-11-16 ENCOUNTER — Encounter: Payer: BC Managed Care – PPO | Admitting: Physician Assistant

## 2014-11-28 ENCOUNTER — Telehealth: Payer: Self-pay | Admitting: Cardiology

## 2014-11-28 DIAGNOSIS — R079 Chest pain, unspecified: Secondary | ICD-10-CM

## 2014-11-28 DIAGNOSIS — R0789 Other chest pain: Secondary | ICD-10-CM

## 2014-11-28 DIAGNOSIS — R0600 Dyspnea, unspecified: Secondary | ICD-10-CM

## 2014-11-28 NOTE — Telephone Encounter (Signed)
Left message for patient to call.

## 2014-11-28 NOTE — Telephone Encounter (Signed)
Pt says he wants another CT angiogram,please call.Pt says he is still having symptoms.

## 2014-11-29 NOTE — Telephone Encounter (Signed)
Left message for pt to call.

## 2014-11-29 NOTE — Telephone Encounter (Signed)
Spoke with pt, he reports he is having similar symptoms as before. There is a dull ache in the left side of his chest from the nipple to the arm pit. He states his brother is a medical doctor and is insisting he have a CTA. Will forward to dr hochrein for review

## 2014-11-30 NOTE — Telephone Encounter (Signed)
Dr. Antoine PocheHochrein and I spoke, he is ok to order coronary CT angio - asked me to call patient and inform him.  I called patient, made him aware - he voiced understanding.  Order submitted.

## 2014-12-05 ENCOUNTER — Encounter: Payer: Self-pay | Admitting: Cardiology

## 2014-12-19 ENCOUNTER — Encounter (HOSPITAL_COMMUNITY): Payer: Self-pay

## 2014-12-19 ENCOUNTER — Ambulatory Visit (HOSPITAL_COMMUNITY)
Admission: RE | Admit: 2014-12-19 | Discharge: 2014-12-19 | Disposition: A | Discharge status: Home / Self Care | Payer: BC Managed Care – PPO | Source: Ambulatory Visit | Attending: Cardiology | Admitting: Cardiology

## 2014-12-19 DIAGNOSIS — R0600 Dyspnea, unspecified: Secondary | ICD-10-CM | POA: Diagnosis not present

## 2014-12-19 DIAGNOSIS — I2584 Coronary atherosclerosis due to calcified coronary lesion: Secondary | ICD-10-CM | POA: Diagnosis not present

## 2014-12-19 DIAGNOSIS — R0789 Other chest pain: Secondary | ICD-10-CM | POA: Insufficient documentation

## 2014-12-19 DIAGNOSIS — Z8249 Family history of ischemic heart disease and other diseases of the circulatory system: Secondary | ICD-10-CM | POA: Insufficient documentation

## 2014-12-19 DIAGNOSIS — I2510 Atherosclerotic heart disease of native coronary artery without angina pectoris: Secondary | ICD-10-CM | POA: Insufficient documentation

## 2014-12-19 DIAGNOSIS — R079 Chest pain, unspecified: Secondary | ICD-10-CM | POA: Diagnosis not present

## 2014-12-19 MED ORDER — IOHEXOL 350 MG/ML SOLN
80.0000 mL | Freq: Once | INTRAVENOUS | Status: AC | PRN
  Administered 2014-12-19: 80 mL via INTRAVENOUS

## 2014-12-19 MED ORDER — NITROGLYCERIN 0.4 MG SL SUBL
0.8000 mg | SUBLINGUAL_TABLET | Freq: Once | SUBLINGUAL | Status: AC
  Administered 2014-12-19: 0.8 mg via SUBLINGUAL
  Filled 2014-12-19: qty 25

## 2014-12-19 MED ORDER — NITROGLYCERIN 0.4 MG SL SUBL
SUBLINGUAL_TABLET | SUBLINGUAL | Status: AC
  Administered 2014-12-19: 0.8 mg via SUBLINGUAL
  Filled 2014-12-19: qty 1

## 2014-12-19 MED ORDER — NITROGLYCERIN 0.4 MG SL SUBL
SUBLINGUAL_TABLET | SUBLINGUAL | Status: AC
  Filled 2014-12-19: qty 1

## 2014-12-19 NOTE — Progress Notes (Signed)
S/w Dr. Delton SeeNelson and transferred pt to CT.  No metoprolol needed d/t heart rate in 60s. Pt denies any chest pain today and has not used Viagra in the last 48hrs per pt.

## 2014-12-20 ENCOUNTER — Ambulatory Visit: Payer: BC Managed Care – PPO | Admitting: Sports Medicine

## 2014-12-24 ENCOUNTER — Ambulatory Visit (INDEPENDENT_AMBULATORY_CARE_PROVIDER_SITE_OTHER): Payer: BC Managed Care – PPO | Admitting: Family Medicine

## 2014-12-24 ENCOUNTER — Ambulatory Visit: Payer: BC Managed Care – PPO | Admitting: Sports Medicine

## 2014-12-24 ENCOUNTER — Encounter: Payer: Self-pay | Admitting: Family Medicine

## 2014-12-24 VITALS — BP 130/82 | HR 55 | Ht 72.0 in | Wt 180.0 lb

## 2014-12-24 DIAGNOSIS — M1611 Unilateral primary osteoarthritis, right hip: Secondary | ICD-10-CM | POA: Diagnosis not present

## 2014-12-24 MED ORDER — TRAMADOL HCL 50 MG PO TABS: ORAL_TABLET | ORAL | Status: DC

## 2014-12-24 NOTE — Patient Instructions (Signed)
Try this for pain management Have a great time in [place]

## 2014-12-25 NOTE — Progress Notes (Signed)
   Subjective:    Patient ID: Edwin [name], male    DOB: [birth year], 59 y.o.   MRN: [id number]  HPI Follow-up right hip pain. Tomorrow he leaves to go on a 2 week vacation to [place] in [place]. He is concerned that he will be hampered by his hip pain and wants [place] to discuss possible hip joint injection which he had considered at last office visit. He is still playing tennis once a week rather than twice a week. Says he does have increase in his hip pain for 2-3 days afterwards. Feels his hip keeps him from being as fast as he needs to be wings on the tennis court but still enjoys playing. He is not having pain that awakens him at night but sometimes has pain that we'll keep him from falling asleep. The left hip does not bother him.   Review of Systems No unusual weight change, no fever, sweats, chills.    Objective:   Physical Exam  Vital signs are reviewed GEN.: Well-developed male no acute distress HIPS: Left hip has full range of motion in motion is painless. The right hip has decreased internal rotation by about 50% and has any point is extremely painful to him. Gait: Antalgic mildly.  IMAGING:      Assessment & Plan:

## 2014-12-25 NOTE — Assessment & Plan Note (Signed)
We discussed options for hip pain. I reviewed his x-rays which show DJD. I do thank hip joint injection might be beneficial to him. We could do it under ultrasound as he is fairly thin I do not think we should undertake that neatly prior to him leaving the country however. We also discussed the option of oral steroids. Less potential complications with oral stair is then with injection however still not this thing I would prefer to do given his imminent travel Ultimately we decided to try some tramadol for pain relief. I recommended he try a single pill receiving to make sure he does not have any kind of weird reaction such as anaphylaxis and otherwise he can use when necessary. He seemed quite happy with this discussion and her ultimate decision. Greater than 50% of our 25 minute office visit was spent in counseling and education regarding these issues. Marland Kitchen

## 2015-02-01 ENCOUNTER — Ambulatory Visit (INDEPENDENT_AMBULATORY_CARE_PROVIDER_SITE_OTHER): Payer: BC Managed Care – PPO | Admitting: Family Medicine

## 2015-02-01 ENCOUNTER — Encounter: Payer: Self-pay | Admitting: Family Medicine

## 2015-02-01 VITALS — BP 130/72 | HR 79 | Temp 97.8°F | Wt 187.0 lb

## 2015-02-01 DIAGNOSIS — J0191 Acute recurrent sinusitis, unspecified: Secondary | ICD-10-CM | POA: Diagnosis not present

## 2015-02-01 MED ORDER — LEVOFLOXACIN 500 MG PO TABS: 500.0000 mg | ORAL_TABLET | Freq: Every day | ORAL | Status: DC

## 2015-02-01 NOTE — Progress Notes (Signed)
Pre visit review using our clinic review tool, if applicable. No additional management support is needed unless otherwise documented below in the visit note. 

## 2015-02-01 NOTE — Progress Notes (Signed)
   Subjective:    Patient ID: Edwin [name], male    DOB: 1956-03-27, 59 y.o.   MRN: [id number]  HPI  Patient seen with over 3 week history of sinus congestion and sinus pressure and intermittent headaches and malaise. He had a leftover Zithromax that he took on a recent trip and did not use and about 3 weeks ago took this and felt better for a few days but then had relapse of symptoms. He has thick green nasal discharge and maxillary sinus pressure. Occasional cough. No fever. History of frequent sinusitis in the past.  Past Medical History  Diagnosis Date  . Allergy   . Arthritis    Past Surgical History  Procedure Laterality Date  . Knee surgery  2009    microfracture surgery  . Tonsillectomy      reports that he quit smoking about 37 years ago. His smoking use included Cigarettes. He has a 5 pack-year smoking history. He has never used smokeless tobacco. He reports that he drinks about 2.0 oz of alcohol per week. He reports that he does not use illicit drugs. family history includes CAD in his brother; CAD (age of onset: 2) in his paternal uncle; CAD (age of onset: 75) in his brother; Dementia in his father; Hyperlipidemia in his father; Multiple sclerosis in his brother. No Known Allergies   Review of Systems  Constitutional: Positive for fatigue. Negative for fever and chills.  HENT: Positive for congestion and sinus pressure.   Respiratory: Positive for cough. Negative for shortness of breath.   Cardiovascular: Negative for chest pain.  Neurological: Negative for headaches.       Objective:   Physical Exam  Constitutional: He appears well-developed and well-nourished.  HENT:  Right Ear: External ear normal.  Left Ear: External ear normal.  Mouth/Throat: Oropharynx is clear and moist.  Erythematous nasal mucosa otherwise clear  Neck: Neck supple.  Cardiovascular: Normal rate and regular rhythm.   Pulmonary/Chest: Effort normal and breath sounds normal. No  respiratory distress. He has no wheezes. He has no rales.  Lymphadenopathy:    He has no cervical adenopathy.          Assessment & Plan:  Recurrent sinusitis. He has not responded well in the past to medications such as amoxicillin, nor with Augmentin. Recent treatment with Zithromax which did not help. Levaquin 500 mg once daily for 10 days. Stay well-hydrated.

## 2015-09-02 ENCOUNTER — Ambulatory Visit (INDEPENDENT_AMBULATORY_CARE_PROVIDER_SITE_OTHER): Payer: BC Managed Care – PPO | Admitting: Family Medicine

## 2015-09-02 ENCOUNTER — Encounter: Payer: Self-pay | Admitting: Family Medicine

## 2015-09-02 VITALS — BP 112/80 | HR 81 | Temp 99.0°F | Wt 181.5 lb

## 2015-09-02 DIAGNOSIS — J309 Allergic rhinitis, unspecified: Secondary | ICD-10-CM | POA: Diagnosis not present

## 2015-09-02 DIAGNOSIS — J209 Acute bronchitis, unspecified: Secondary | ICD-10-CM

## 2015-09-02 DIAGNOSIS — R05 Cough: Secondary | ICD-10-CM

## 2015-09-02 DIAGNOSIS — R059 Cough, unspecified: Secondary | ICD-10-CM

## 2015-09-02 MED ORDER — HYDROCODONE-HOMATROPINE 5-1.5 MG/5ML PO SYRP: 5.0000 mL | ORAL_SOLUTION | Freq: Three times a day (TID) | ORAL | Status: DC | PRN

## 2015-09-02 NOTE — Patient Instructions (Addendum)
Please continue antibiotic as prescribed by your physician. Can use hydromet for cough as needed. If symptom of cough does not improve in 3-4 days, worsen, or you develop a fever >101, please contact clinic for further evaluation. OTC cough medication option is Mucinex DM

## 2015-09-02 NOTE — Progress Notes (Signed)
Subjective:    Patient ID: Edwin [name], male    DOB: Oct 30, 1955, 60 y.o.   MRN: [id number]  HPI  Edwin [name] is a 60 year old male who presents today with a cough that has been present for 3 days. Three days ago he saw his allergist provider who prescribed cefdinir and prednisone for symptoms. 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  He is requesting evaluation for his cough and treatment recommendations. Associated symptoms of rhinitis with clear nasal drainage and post nasal drip noted.  He denies fever, chills, sweats, sinus pressure/pain, dental pain, ear pressure/pain, N/V and diarrhea. Treatment at home includes Robitussin maximum strength which provides limited benefit. Pertinent history of allergic rhinitis and season allergies is noted.   Review of Systems  Constitutional: Negative for fever, chills and fatigue.  HENT: Positive for postnasal drip and rhinorrhea. Negative for congestion, ear pain, sinus pressure and sore throat.   Respiratory: Negative for cough, chest tightness, shortness of breath and wheezing.   Cardiovascular: Negative for chest pain and palpitations.  Gastrointestinal: Negative for nausea, vomiting and diarrhea.  Musculoskeletal: Negative for myalgias.  Skin: Negative for rash.  Neurological: Negative for dizziness and headaches.   Past Medical History  Diagnosis Date  . Allergy   . Arthritis     Social History   Social History  . Marital Status: Married    Spouse Name: N/A  . Number of Children: 2  . Years of Education: N/A   Occupational History  . Professor of Therapeutic Recreation    Social History Main Topics  . Smoking status: Former Smoker -- 1.00 packs/day for 5 years    Types: Cigarettes    Quit date: 10/01/1977  . Smokeless tobacco: Never Used  . Alcohol Use: 2.0 oz/week    4 Standard  drinks or equivalent per week  . Drug Use: No  . Sexual Activity: Not on file   Other Topics Concern  . Not on file   Social History Narrative    Past Surgical History  Procedure Laterality Date  . Knee surgery  2009    microfracture surgery  . Tonsillectomy      Family History  Problem Relation Age of Onset  . Hyperlipidemia Father   . Dementia Father   . Multiple sclerosis Brother   . CAD Brother 56    3 stents  . CAD Brother     70% stenosis in on coronary  . CAD Paternal Uncle 61    Died of MI    No Known Allergies  Current Outpatient Prescriptions on File Prior to Visit  Medication Sig Dispense Refill  . azelastine (ASTELIN) 0.1 % nasal spray Place 1 spray into both nostrils at bedtime.     . fluticasone (FLONASE) 50 MCG/ACT nasal spray Place 1 spray into both nostrils daily.     Marland Kitchen ibuprofen (ADVIL,MOTRIN) 200 MG tablet Take 200 mg by mouth every 6 (six) hours as needed.    Marland Kitchen levofloxacin (LEVAQUIN) 500 MG tablet Take 1 tablet (500 mg total) by mouth daily. 10 tablet 0  . Multiple Vitamins-Minerals (MULTIVITAMIN WITH MINERALS) tablet Take 1 tablet by mouth daily.      Marland Kitchen VIAGRA 100 MG tablet TAKE 1/2 TO 1 TABLET BY MOUTH DAILY AS NEEDED FOR ERECTILE DYSFUNCTION 60 tablet 1   No current facility-administered medications on file prior  to visit.    BP 112/80 mmHg  Pulse 81  Temp(Src) 99 F (37.2 C) (Oral)  Wt 181 lb 8 oz (82.328 kg)  SpO2 97%       Objective:   Physical Exam  Constitutional: He is oriented to person, place, and time. He appears well-developed and well-nourished.  Cardiovascular: Normal rate and regular rhythm.   No murmur heard. Pulmonary/Chest: Effort normal and breath sounds normal. He has no wheezes. He has no rales.  Lymphadenopathy:    He has no cervical adenopathy.  Neurological: He is alert and oriented to person, place, and time.  Skin: Skin is warm and dry. No rash noted.       Assessment & Plan:  1. Cough Mucinex DM for  cough as needed and Hydromet for cough that is not controlled with Mucinex DM. If symptoms do not improve in 3-4 days, worsen, or you develop a fever >101, please contact clinic for further evaluation.   2. Allergic rhinitis, unspecified allergic rhinitis type Continue medications as prescribed by your allergist  3. Acute bronchitis, unspecified organism Supportive measures of rest, increased fluids, and complete prednisone as prescribed by your allergist.

## 2015-10-04 ENCOUNTER — Other Ambulatory Visit (INDEPENDENT_AMBULATORY_CARE_PROVIDER_SITE_OTHER): Payer: BC Managed Care – PPO

## 2015-10-04 DIAGNOSIS — Z0000 Encounter for general adult medical examination without abnormal findings: Secondary | ICD-10-CM | POA: Diagnosis not present

## 2015-10-04 LAB — CBC WITH DIFFERENTIAL/PLATELET
Basophils Absolute: 0 10*3/uL (ref 0.0–0.1)
Basophils Relative: 0.3 % (ref 0.0–3.0)
EOS PCT: 7.6 % — AB (ref 0.0–5.0)
Eosinophils Absolute: 0.4 10*3/uL (ref 0.0–0.7)
HCT: 43.1 % (ref 39.0–52.0)
HEMOGLOBIN: 14.9 g/dL (ref 13.0–17.0)
LYMPHS ABS: 1.2 10*3/uL (ref 0.7–4.0)
Lymphocytes Relative: 24 % (ref 12.0–46.0)
MCHC: 34.7 g/dL (ref 30.0–36.0)
MCV: 95.4 fl (ref 78.0–100.0)
Monocytes Absolute: 0.4 10*3/uL (ref 0.1–1.0)
Monocytes Relative: 7.6 % (ref 3.0–12.0)
NEUTROS PCT: 60.5 % (ref 43.0–77.0)
Neutro Abs: 3 10*3/uL (ref 1.4–7.7)
Platelets: 183 10*3/uL (ref 150.0–400.0)
RBC: 4.51 Mil/uL (ref 4.22–5.81)
RDW: 13 % (ref 11.5–15.5)
WBC: 4.9 10*3/uL (ref 4.0–10.5)

## 2015-10-04 LAB — BASIC METABOLIC PANEL
BUN: 13 mg/dL (ref 6–23)
CO2: 32 mEq/L (ref 19–32)
Calcium: 9.4 mg/dL (ref 8.4–10.5)
Chloride: 106 mEq/L (ref 96–112)
Creatinine, Ser: 0.98 mg/dL (ref 0.40–1.50)
GFR: 82.73 mL/min (ref >60.00)
Glucose, Bld: 88 mg/dL (ref 70–99)
Potassium: 4.2 mEq/L (ref 3.5–5.1)
Sodium: 143 mEq/L (ref 135–145)

## 2015-10-04 LAB — LIPID PANEL
CHOL/HDL RATIO: 4
Cholesterol: 213 mg/dL — ABNORMAL HIGH (ref 0–200)
HDL: 51.7 mg/dL (ref >39.00)
LDL Cholesterol: 142 mg/dL — ABNORMAL HIGH (ref 0–99)
NonHDL: 161.05
TRIGLYCERIDES: 97 mg/dL (ref 0.0–149.0)
VLDL: 19.4 mg/dL (ref 0.0–40.0)

## 2015-10-04 LAB — HEPATIC FUNCTION PANEL
ALT: 20 U/L (ref 0–53)
AST: 23 U/L (ref 0–37)
Albumin: 4.3 g/dL (ref 3.5–5.2)
Alkaline Phosphatase: 71 U/L (ref 39–117)
BILIRUBIN TOTAL: 0.8 mg/dL (ref 0.2–1.2)
Bilirubin, Direct: 0.1 mg/dL (ref 0.0–0.3)
Total Protein: 6.3 g/dL (ref 6.0–8.3)

## 2015-10-04 LAB — PSA: PSA: 1.12 ng/mL (ref 0.10–4.00)

## 2015-10-04 LAB — TSH: TSH: 2.68 u[IU]/mL (ref 0.35–4.50)

## 2015-10-09 ENCOUNTER — Ambulatory Visit (INDEPENDENT_AMBULATORY_CARE_PROVIDER_SITE_OTHER): Payer: BC Managed Care – PPO | Admitting: Family Medicine

## 2015-10-09 ENCOUNTER — Encounter: Payer: Self-pay | Admitting: Family Medicine

## 2015-10-09 VITALS — BP 110/80 | HR 77 | Temp 97.7°F | Ht 70.5 in | Wt 180.8 lb

## 2015-10-09 DIAGNOSIS — Z0000 Encounter for general adult medical examination without abnormal findings: Secondary | ICD-10-CM | POA: Diagnosis not present

## 2015-10-09 NOTE — Patient Instructions (Signed)
Check on coverage for shingles vaccine. and let us know if interested. We will set up GI referral.

## 2015-10-09 NOTE — Progress Notes (Signed)
   Subjective:    Patient ID: Edwin SarnaStuart J [name], male    DOB: March 09, 1956, 60 y.o.   MRN: [id number]  HPI Patient seen for complete physical. Generally very healthy. He's had some issues with progressive right hip pains and saw orthopedist and he does have significant osteoarthritis. This is limiting his tennis. He plans to see orthopedist in follow-up soon.  Last colonoscopy apparently was 2007 and patient is requesting referral for follow-up. No history of shingles vaccine. No contraindications. He has seasonal allergies and takes medication for that. Never smoked  Past Medical History  Diagnosis Date  . Allergy   . Arthritis    Past Surgical History  Procedure Laterality Date  . Knee surgery  2009    microfracture surgery  . Tonsillectomy      reports that he quit smoking about 38 years ago. His smoking use included Cigarettes. He has a 5 pack-year smoking history. He has never used smokeless tobacco. He reports that he drinks about 2.0 oz of alcohol per week. He reports that he does not use illicit drugs. family history includes CAD in his brother; CAD (age of onset: 1850) in his paternal uncle; CAD (age of onset: 6956) in his brother; Dementia in his father; Hyperlipidemia in his father; Multiple sclerosis in his brother. No Known Allergies    Review of Systems  Constitutional: Negative for fever, activity change, appetite change and fatigue.  HENT: Negative for congestion, ear pain and trouble swallowing.   Eyes: Negative for pain and visual disturbance.  Respiratory: Negative for cough, shortness of breath and wheezing.   Cardiovascular: Negative for chest pain and palpitations.  Gastrointestinal: Negative for nausea, vomiting, abdominal pain, diarrhea, constipation, blood in stool, abdominal distention and rectal pain.  Genitourinary: Negative for dysuria, hematuria and testicular pain.  Musculoskeletal: Positive for arthralgias (right hip as per history of present illness).  Negative for joint swelling.  Skin: Negative for rash.  Neurological: Negative for dizziness, syncope and headaches.  Hematological: Negative for adenopathy.  Psychiatric/Behavioral: Negative for confusion and dysphoric mood.       Objective:   Physical Exam  Constitutional: He is oriented to person, place, and time. He appears well-developed and well-nourished. No distress.  HENT:  Head: Normocephalic and atraumatic.  Right Ear: External ear normal.  Left Ear: External ear normal.  Mouth/Throat: Oropharynx is clear and moist.  Eyes: Conjunctivae and EOM are normal. Pupils are equal, round, and reactive to light.  Neck: Normal range of motion. Neck supple. No thyromegaly present.  Cardiovascular: Normal rate, regular rhythm and normal heart sounds.   No murmur heard. Pulmonary/Chest: No respiratory distress. He has no wheezes. He has no rales.  Abdominal: Soft. Bowel sounds are normal. He exhibits no distension and no mass. There is no tenderness. There is no rebound and no guarding.  Musculoskeletal: He exhibits no edema.  Lymphadenopathy:    He has no cervical adenopathy.  Neurological: He is alert and oriented to person, place, and time. He displays normal reflexes. No cranial nerve deficit.  Skin: No rash noted.  Psychiatric: He has a normal mood and affect.          Assessment & Plan:  Physical exam. Labs reviewed. No major concerns. Mild hyperlipidemia. 10 year risk of CAD event 6.9%. Offered shingles vaccine. Patient will check on coverage first. Set up GI referral for repeat colonoscopy

## 2015-10-09 NOTE — Progress Notes (Signed)
Pre visit review using our clinic review tool, if applicable. No additional management support is needed unless otherwise documented below in the visit note. 

## 2015-10-30 ENCOUNTER — Telehealth: Payer: Self-pay | Admitting: Family Medicine

## 2015-10-30 NOTE — Telephone Encounter (Signed)
Clearance in your yellow folder.

## 2015-10-30 NOTE — Telephone Encounter (Signed)
I will be happy to sign off surgical clearance.  Usually, their office sends over.  Would only pursue donating and holding his blood in advance of surgery if Dr Lequita HaltAluisio advises.

## 2015-10-30 NOTE — Telephone Encounter (Signed)
Pt just received packet from Dr Lequita HaltAluisio and he needs a letter of medical clearance. Pt just had CPE on 3/29 and hopes Dr Caryl NeverBurchette can just write a letter.  Also, pt would like to know if Dr Caryl NeverBurchette thinks he should give blood prior to the surgery, and in case pt needs blood,  he can have his own. Ideas on that?

## 2015-11-01 ENCOUNTER — Ambulatory Visit (INDEPENDENT_AMBULATORY_CARE_PROVIDER_SITE_OTHER): Payer: BC Managed Care – PPO | Admitting: Family Medicine

## 2015-11-01 ENCOUNTER — Encounter: Payer: Self-pay | Admitting: Family Medicine

## 2015-11-01 VITALS — BP 110/70 | HR 88 | Temp 98.3°F | Ht 70.5 in | Wt 180.0 lb

## 2015-11-01 DIAGNOSIS — J0100 Acute maxillary sinusitis, unspecified: Secondary | ICD-10-CM

## 2015-11-01 MED ORDER — PREDNISONE 10 MG PO TABS: ORAL_TABLET | ORAL | Status: DC

## 2015-11-01 MED ORDER — CEFUROXIME AXETIL 500 MG PO TABS: 500.0000 mg | ORAL_TABLET | Freq: Two times a day (BID) | ORAL | Status: DC

## 2015-11-01 NOTE — Progress Notes (Signed)
Pre visit review using our clinic review tool, if applicable. No additional management support is needed unless otherwise documented below in the visit note. 

## 2015-11-01 NOTE — Progress Notes (Signed)
   Subjective:    Patient ID: Edwin SarnaStuart J [name], male    DOB: 12-23-1955, 60 y.o.   MRN: [id number]  HPI  Patient seen with progressive left maxillary facial pain and headache over the past 10 days or so.  He has history of severe spring allergies. He's been followed by allergist. He takes Flonase and Astelin regularly along with oral antihistamines.  He feels that his current symptoms started with severe allergies.  Now has progressive left maxillary facial pain. Some yellowish nasal discharge.  Increased malaise. He does regular saline nasal irrigation.  He feels exactly as he has the past with acute sinusitis.  Sometimes episodes take weeks to resolve.  Usually does best with combination of steroid and antibiotic.  Past Medical History  Diagnosis Date  . Allergy   . Arthritis    Past Surgical History  Procedure Laterality Date  . Knee surgery  2009    microfracture surgery  . Tonsillectomy      reports that he quit smoking about 38 years ago. His smoking use included Cigarettes. He has a 5 pack-year smoking history. He has never used smokeless tobacco. He reports that he drinks about 2.0 oz of alcohol per week. He reports that he does not use illicit drugs. family history includes CAD in his brother; CAD (age of onset: 350) in his paternal uncle; CAD (age of onset: 2356) in his brother; Dementia in his father; Hyperlipidemia in his father; Multiple sclerosis in his brother. No Known Allergies    Review of Systems  Constitutional: Positive for fatigue. Negative for fever and chills.  HENT: Positive for congestion and sinus pressure.   Respiratory: Positive for cough. Negative for shortness of breath.   Neurological: Positive for headaches.       Objective:   Physical Exam  Constitutional: He appears well-developed and well-nourished.  HENT:  Right Ear: External ear normal.  Left Ear: External ear normal.  Mouth/Throat: Oropharynx is clear and moist.  Erythematous nasal  mucosa. Thick yellow mucus bilaterally  Neck: Neck supple.  Cardiovascular: Normal rate and regular rhythm.   Pulmonary/Chest: Effort normal and breath sounds normal. No respiratory distress. He has no wheezes. He has no rales.  Lymphadenopathy:    He has no cervical adenopathy.          Assessment & Plan:  Acute left maxillary sinusitis. He is doing virtually everything he can for prevention with nasal sprays and saline irrigation as above. Brief prednisone taper starting at 40 mg daily. Ceftin  500 mg twice daily for 10 days. Follow-up as needed  Kristian CoveyBruce W Burchette MD  [facility]

## 2015-11-14 ENCOUNTER — Ambulatory Visit: Payer: Self-pay | Admitting: Orthopedic Surgery

## 2015-11-14 NOTE — Progress Notes (Signed)
Preoperative surgical orders have been place into the Epic hospital system for Edwin [name] on 11/14/2015, 10:31 AM  by Patrica DuelPERKINS, ALEXZANDREW for surgery on 12-02-15.  Preop Total Hip - Anterior Approach orders including IV Tylenol, and IV Decadron as long as there are no contraindications to the above medications. Avel Peacerew Perkins, PA-C

## 2015-11-19 ENCOUNTER — Telehealth: Payer: Self-pay | Admitting: Family Medicine

## 2015-11-19 NOTE — Telephone Encounter (Signed)
Pt is having hip surgery in 2 wk and still having lingering cough and would like something call into [facility] [address]. Pt saw dr Caryl Neverburchette on 4-21 for sinus infection

## 2015-11-20 ENCOUNTER — Other Ambulatory Visit: Payer: Self-pay

## 2015-11-20 ENCOUNTER — Encounter (HOSPITAL_COMMUNITY)
Admission: RE | Admit: 2015-11-20 | Discharge: 2015-11-20 | Disposition: A | Discharge status: Home / Self Care | Payer: BC Managed Care – PPO | Source: Ambulatory Visit | Attending: Orthopedic Surgery | Admitting: Orthopedic Surgery

## 2015-11-20 ENCOUNTER — Encounter (HOSPITAL_COMMUNITY): Payer: Self-pay

## 2015-11-20 DIAGNOSIS — Z01818 Encounter for other preprocedural examination: Secondary | ICD-10-CM | POA: Diagnosis present

## 2015-11-20 DIAGNOSIS — Z01812 Encounter for preprocedural laboratory examination: Secondary | ICD-10-CM | POA: Insufficient documentation

## 2015-11-20 DIAGNOSIS — R9431 Abnormal electrocardiogram [ECG] [EKG]: Secondary | ICD-10-CM | POA: Insufficient documentation

## 2015-11-20 DIAGNOSIS — Z0183 Encounter for blood typing: Secondary | ICD-10-CM | POA: Insufficient documentation

## 2015-11-20 DIAGNOSIS — M1611 Unilateral primary osteoarthritis, right hip: Secondary | ICD-10-CM | POA: Insufficient documentation

## 2015-11-20 DIAGNOSIS — I444 Left anterior fascicular block: Secondary | ICD-10-CM | POA: Diagnosis not present

## 2015-11-20 HISTORY — DX: Angina pectoris, unspecified: I20.9

## 2015-11-20 HISTORY — DX: Family history of other specified conditions: Z84.89

## 2015-11-20 HISTORY — DX: Chronic sinusitis, unspecified: J32.9

## 2015-11-20 LAB — COMPREHENSIVE METABOLIC PANEL
ALBUMIN: 4.2 g/dL (ref 3.5–5.0)
ALT: 23 U/L (ref 17–63)
AST: 24 U/L (ref 15–41)
Alkaline Phosphatase: 75 U/L (ref 38–126)
Anion gap: 9 (ref 5–15)
BILIRUBIN TOTAL: 1.1 mg/dL (ref 0.3–1.2)
BUN: 11 mg/dL (ref 6–20)
CO2: 28 mmol/L (ref 22–32)
CREATININE: 0.94 mg/dL (ref 0.61–1.24)
Calcium: 9.3 mg/dL (ref 8.9–10.3)
Chloride: 106 mmol/L (ref 101–111)
GFR calc Af Amer: >60 mL/min (ref >60)
GFR calc non Af Amer: >60 mL/min (ref >60)
GLUCOSE: 95 mg/dL (ref 65–99)
Potassium: 4.3 mmol/L (ref 3.5–5.1)
Sodium: 143 mmol/L (ref 135–145)
TOTAL PROTEIN: 6.8 g/dL (ref 6.5–8.1)

## 2015-11-20 LAB — URINALYSIS, ROUTINE W REFLEX MICROSCOPIC
BILIRUBIN URINE: NEGATIVE
Glucose, UA: NEGATIVE mg/dL
Hgb urine dipstick: NEGATIVE
KETONES UR: NEGATIVE mg/dL
LEUKOCYTES UA: NEGATIVE
NITRITE: NEGATIVE
Protein, ur: NEGATIVE mg/dL
SPECIFIC GRAVITY, URINE: 1.016 (ref 1.005–1.030)
pH: 6.5 (ref 5.0–8.0)

## 2015-11-20 LAB — TYPE AND SCREEN
ABO/RH(D): O POS
ANTIBODY SCREEN: NEGATIVE

## 2015-11-20 LAB — PROTIME-INR
INR: 0.98 (ref 0.00–1.49)
Prothrombin Time: 13.2 seconds (ref 11.6–15.2)

## 2015-11-20 LAB — CBC
HEMATOCRIT: 44 % (ref 39.0–52.0)
HEMOGLOBIN: 15.2 g/dL (ref 13.0–17.0)
MCH: 33 pg (ref 26.0–34.0)
MCHC: 34.5 g/dL (ref 30.0–36.0)
MCV: 95.4 fL (ref 78.0–100.0)
Platelets: 182 10*3/uL (ref 150–400)
RBC: 4.61 MIL/uL (ref 4.22–5.81)
RDW: 13.1 % (ref 11.5–15.5)
WBC: 9.2 10*3/uL (ref 4.0–10.5)

## 2015-11-20 LAB — APTT: APTT: 30 s (ref 24–37)

## 2015-11-20 LAB — SURGICAL PCR SCREEN
MRSA, PCR: NEGATIVE
STAPHYLOCOCCUS AUREUS: NEGATIVE

## 2015-11-20 LAB — ABO/RH: ABO/RH(D): O POS

## 2015-11-20 MED ORDER — HYDROCODONE-HOMATROPINE 5-1.5 MG/5ML PO SYRP: 5.0000 mL | ORAL_SOLUTION | Freq: Four times a day (QID) | ORAL | Status: DC | PRN

## 2015-11-20 NOTE — Telephone Encounter (Signed)
Clarify symptoms.  Any fever or persistent sinus/facial pain. Is he requesting cough syrup?

## 2015-11-20 NOTE — Telephone Encounter (Signed)
Hycodan cough syrup one tsp po q 6 hours prn cough !20 ml.

## 2015-11-20 NOTE — Patient Instructions (Signed)
Edwin [name]  11/20/2015   Your procedure is scheduled on: Monday Dec 02, 2015  Report to [facility]  Entrance take [place]  elevators to 3rd floor to  Short Stay Center at 11:45 AM.  Call this number if you have problems the morning of surgery [phone]   Remember: ONLY 1 PERSON MAY GO WITH YOU TO SHORT STAY TO GET  READY MORNING OF YOUR SURGERY.  Do not eat food After Midnight but may take clear liquid diet till 8:45 am day of surgery then nothing by mouth.      Take these medicines the morning of surgery with A SIP OF WATER: NONE                               You may not have any metal on your body including hair pins and              piercings  Do not wear jewelry,  lotions, powders or colognes, deodorant             Men may shave face and neck.   Do not bring valuables to the hospital. [facility] IS NOT             RESPONSIBLE   FOR VALUABLES.  Contacts, dentures or bridgework may not be worn into surgery.  Leave suitcase in the car. After surgery it may be brought to your room.                Please read over the following fact sheets you were given:MRSA INFORMATION SHEET; INCENTIVE SPIROMETER; BLOOD TRANSFUSION INFORMATION SHEET  _____________________________________________________________________             [facility] - Preparing for Surgery Before surgery, you can play an important role.  Because skin is not sterile, your skin needs to be as free of germs as possible.  You can reduce the number of germs on your skin by washing with CHG (chlorahexidine gluconate) soap before surgery.  CHG is an antiseptic cleaner which kills germs and bonds with the skin to continue killing germs even after washing. Please DO NOT use if you have an allergy to CHG or antibacterial soaps.  If your skin becomes reddened/irritated stop using the CHG and inform your nurse when you arrive at Short Stay. Do not shave (including legs and underarms) for at least  48 hours prior to the first CHG shower.  You may shave your face/neck. Please follow these instructions carefully:  1.  Shower with CHG Soap the night before surgery and the  morning of Surgery.  2.  If you choose to wash your hair, wash your hair first as usual with your  normal  shampoo.  3.  After you shampoo, rinse your hair and body thoroughly to remove the  shampoo.                           4.  Use CHG as you would any other liquid soap.  You can apply chg directly  to the skin and wash                       Gently with a scrungie or clean washcloth.  5.  Apply the CHG Soap to your body ONLY FROM  THE NECK DOWN.   Do not use on face/ open                           Wound or open sores. Avoid contact with eyes, ears mouth and genitals (private parts).                       Wash face,  Genitals (private parts) with your normal soap.             6.  Wash thoroughly, paying special attention to the area where your surgery  will be performed.  7.  Thoroughly rinse your body with warm water from the neck down.  8.  DO NOT shower/wash with your normal soap after using and rinsing off  the CHG Soap.                9.  Pat yourself dry with a clean towel.            10.  Wear clean pajamas.            11.  Place clean sheets on your bed the night of your first shower and do not  sleep with pets. Day of Surgery : Do not apply any lotions/deodorants the morning of surgery.  Please wear clean clothes to the hospital/surgery center.  FAILURE TO FOLLOW THESE INSTRUCTIONS MAY RESULT IN THE CANCELLATION OF YOUR SURGERY PATIENT SIGNATURE_________________________________  NURSE SIGNATURE__________________________________  ________________________________________________________________________   Edwin [name]  An incentive spirometer is a tool that can help keep your lungs clear and active. This tool measures how well you are filling your lungs with each breath. Taking long deep breaths may  help reverse or decrease the chance of developing breathing (pulmonary) problems (especially infection) following:  A long period of time when you are unable to move or be active. BEFORE THE PROCEDURE   If the spirometer includes an indicator to show your best effort, your nurse or respiratory therapist will set it to a desired goal.  If possible, sit up straight or lean slightly forward. Try not to slouch.  Hold the incentive spirometer in an upright position. INSTRUCTIONS FOR USE   Sit on the edge of your bed if possible, or sit up as far as you can in bed or on a chair.  Hold the incentive spirometer in an upright position.  Breathe out normally.  Place the mouthpiece in your mouth and seal your lips tightly around it.  Breathe in slowly and as deeply as possible, raising the piston or the ball toward the top of the column.  Hold your breath for 3-5 seconds or for as long as possible. Allow the piston or ball to fall to the bottom of the column.  Remove the mouthpiece from your mouth and breathe out normally.  Rest for a few seconds and repeat Steps 1 through 7 at least 10 times every 1-2 hours when you are awake. Take your time and take a few normal breaths between deep breaths.  The spirometer may include an indicator to show your best effort. Use the indicator as a goal to work toward during each repetition.  After each set of 10 deep breaths, practice coughing to be sure your lungs are clear. If you have an incision (the cut made at the time of surgery), support your incision when coughing by placing a pillow or rolled up towels firmly against it. Once you are  able to get out of bed, walk around indoors and cough well. You may stop using the incentive spirometer when instructed by your caregiver.  RISKS AND COMPLICATIONS  Take your time so you do not get dizzy or light-headed.  If you are in pain, you may need to take or ask for pain medication before doing incentive  spirometry. It is harder to take a deep breath if you are having pain. AFTER USE  Rest and breathe slowly and easily.  It can be helpful to keep track of a log of your progress. Your caregiver can provide you with a simple table to help with this. If you are using the spirometer at home, follow these instructions: SEEK MEDICAL CARE IF:   You are having difficultly using the spirometer.  You have trouble using the spirometer as often as instructed.  Your pain medication is not giving enough relief while using the spirometer.  You develop fever of 100.5 F (38.1 C) or higher. SEEK IMMEDIATE MEDICAL CARE IF:   You cough up bloody sputum that had not been present before.  You develop fever of 102 F (38.9 C) or greater.  You develop worsening pain at or near the incision site. MAKE SURE YOU:   Understand these instructions.  Will watch your condition.  Will get help right away if you are not doing well or get worse. Document Released: 11/09/2006 Document Revised: 09/21/2011 Document Reviewed: 01/10/2007 ExitCare Patient Information 2014 ExitCare, [place].   ________________________________________________________________________  WHAT IS A BLOOD TRANSFUSION? Blood Transfusion Information  A transfusion is the replacement of blood or some of its parts. Blood is made up of multiple cells which provide different functions.  Red blood cells carry oxygen and are used for blood loss replacement.  White blood cells fight against infection.  Platelets control bleeding.  Plasma helps clot blood.  Other blood products are available for specialized needs, such as hemophilia or other clotting disorders. BEFORE THE TRANSFUSION  Who gives blood for transfusions?   Healthy volunteers who are fully evaluated to make sure their blood is safe. This is blood bank blood. Transfusion therapy is the safest it has ever been in the practice of medicine. Before blood is taken from a donor, a  complete history is taken to make sure that person has no history of diseases nor engages in risky social behavior (examples are intravenous drug use or sexual activity with multiple partners). The donor's travel history is screened to minimize risk of transmitting infections, such as malaria. The donated blood is tested for signs of infectious diseases, such as HIV and hepatitis. The blood is then tested to be sure it is compatible with you in order to minimize the chance of a transfusion reaction. If you or a relative donates blood, this is often done in anticipation of surgery and is not appropriate for emergency situations. It takes many days to process the donated blood. RISKS AND COMPLICATIONS Although transfusion therapy is very safe and saves many lives, the main dangers of transfusion include:   Getting an infectious disease.  Developing a transfusion reaction. This is an allergic reaction to something in the blood you were given. Every precaution is taken to prevent this. The decision to have a blood transfusion has been considered carefully by your caregiver before blood is given. Blood is not given unless the benefits outweigh the risks. AFTER THE TRANSFUSION  Right after receiving a blood transfusion, you will usually feel much better and more energetic. This is especially  true if your red blood cells have gotten low (anemic). The transfusion raises the level of the red blood cells which carry oxygen, and this usually causes an energy increase.  The nurse administering the transfusion will monitor you carefully for complications. HOME CARE INSTRUCTIONS  No special instructions are needed after a transfusion. You may find your energy is better. Speak with your caregiver about any limitations on activity for underlying diseases you may have. SEEK MEDICAL CARE IF:   Your condition is not improving after your transfusion.  You develop redness or irritation at the intravenous (IV)  site. SEEK IMMEDIATE MEDICAL CARE IF:  Any of the following symptoms occur over the next 12 hours:  Shaking chills.  You have a temperature by mouth above 102 F (38.9 C), not controlled by medicine.  Chest, back, or muscle pain.  People around you feel you are not acting correctly or are confused.  Shortness of breath or difficulty breathing.  Dizziness and fainting.  You get a rash or develop hives.  You have a decrease in urine output.  Your urine turns a dark color or changes to pink, red, or brown. Any of the following symptoms occur over the next 10 days:  You have a temperature by mouth above 102 F (38.9 C), not controlled by medicine.  Shortness of breath.  Weakness after normal activity.  The white part of the eye turns yellow (jaundice).  You have a decrease in the amount of urine or are urinating less often.  Your urine turns a dark color or changes to pink, red, or brown. Document Released: 06/26/2000 Document Revised: 09/21/2011 Document Reviewed: 02/13/2008 ExitCare Patient Information 2014 ExitCare, [place].  _______________________________________________________________________    CLEAR LIQUID DIET   Foods Allowed                                                                     Foods Excluded  Coffee and tea, regular and decaf                             liquids that you cannot  Plain Jell-O in any flavor                                             [name] through such as: Fruit ices (not with fruit pulp)                                     milk, soups, orange juice  Iced Popsicles                                    All solid food Carbonated beverages, regular and diet                                    Cranberry, grape and apple juices Sports drinks like Gatorade Lightly  seasoned clear broth or consume(fat free) Sugar, honey syrup  Sample Menu Breakfast                                Lunch                                     Supper Cranberry  juice                    Beef broth                            Chicken broth Jell-O                                     Grape juice                           Apple juice Coffee or tea                        Jell-O                                      Popsicle                                                Coffee or tea                        Coffee or tea  _____________________________________________________________________

## 2015-11-20 NOTE — Telephone Encounter (Signed)
Pt has completed antibiotic and steroid with relief. He c/o a persistent lingering dry cough. He tried robitussin with some relief. He is requesting a cough medication. Denies any fevers or other worrisome sx.

## 2015-11-20 NOTE — Telephone Encounter (Signed)
Printed for pickup.

## 2015-11-20 NOTE — Telephone Encounter (Signed)
Pt is aware that RX is up front for pick up. 

## 2015-11-20 NOTE — Progress Notes (Signed)
Clearance note per chart per Dr Caryl NeverBurchette 10/09/2015

## 2015-11-20 NOTE — Telephone Encounter (Signed)
Please advise on medication or does patient need to be re seen?

## 2015-11-21 NOTE — Progress Notes (Signed)
Pt states takes allergy shots 1 time per week

## 2015-11-22 NOTE — Progress Notes (Signed)
Dr Hollis/anesthesia reviewed pt H&P, EKG history, and cardiac workup reports all in epic. No orders given. Anesthesia to see day of surgery.

## 2015-11-28 ENCOUNTER — Ambulatory Visit: Payer: Self-pay | Admitting: Orthopedic Surgery

## 2015-11-28 NOTE — H&P (Signed)
Edwin Lo PHd DOB: 09/27/1955 Married / Language: English / Race: White Male  Date of Admission:  12/02/2015  CC:  Right hip pain  History of Present Illness The patient is a 59 year old male who comes in for a preoperative History and Physical. The patient is scheduled for a right total hip arthroplasty (anterior) to be performed by Dr. Frank V. Aluisio, MD at [facility] on 12-02-2015. The patient reports right hip problems including pain symptoms that have been present for 1 year(s) (4months). The symptoms began without any known injury (Patient states that he started having pain in his groin over a year ago. He saw a sports medicine doctor and had xrays. He has been doing home exercises as instructed. About three weeks ago his pain increased and has bothered him even with resting. He enjoys playing tennis, but has not been able to do that. He taking Motrin occasionally. He brought a copy of his x-rays with him today.). Edwin [name] has had problems with his right hip now for a few years. For the past year, it has gotten progressively worse. He has pain in his groin and anterior thigh radiating now to his knee. He has had a significant loss of range of motion. He says he is limping all the time. He tries to remain very active, but when he does activities such as tennis, he has a marked increase in pain and is now doing a lot less than he was previously. He is at a stage where the right hip is having a very negative effect on his life. He is not having any left hip pain. He is not having any back pain, lower extremity weakness or paresthesia. Recently he has also started developing right hip pain at night and it is making it more difficult for him to sleep. He said he had x-rays done about a year ago, and was told he had some arthritis at that point. He was seen in the clinic and felt that he would benefit from undergoing a total hip arthroplasty. They have been treated conservatively in the  past for the above stated problem and despite conservative measures, they continue to have progressive pain and severe functional limitations and dysfunction. They have failed non-operative management including home exercise, medications. It is felt that they would benefit from undergoing total joint replacement. Risks and benefits of the procedure have been discussed with the patient and they elect to proceed with surgery. There are no active contraindications to surgery such as ongoing infection or rapidly progressive neurological disease.  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)   Allergies No Known Drug Allergies  Allergies Reconciled   Family History  Family history unknown - Adopted  First Degree Relatives  reported  Social History Children  2 Current drinker  10/15/2015: Currently drinks beer 5-7 times per week Current work status  working full time Exercise  Exercises daily; does running / walking, individual sport and gym / weights Living situation  live with spouse Marital status  married No history of drug/alcohol rehab  Not under pain contract  Number of flights of stairs before winded  greater than 5 Tobacco / smoke exposure  10/15/2015: no Tobacco use  10/15/2015: never smoker Advance Directives  Living Will, Healthcare POA Post-Surgical Plans  Home with Wife  Medication History Fluticasone Propionate (50MCG/ACT Suspension, Nasal) Active.  Past Surgical History Arthroscopy of Knee  left Vasectomy    Review of Systems  General Not   Present- Chills, Fatigue, Fever, Memory Loss, Night Sweats, Weight Gain and Weight Loss. Skin Not Present- Eczema, Hives, Itching, Lesions and Rash. HEENT Not Present- Dentures, Double Vision, Headache, Hearing Loss, Tinnitus and Visual Loss. Respiratory Not Present- Allergies, Chronic Cough, Coughing up blood, Shortness of breath at rest and Shortness of breath with  exertion. Cardiovascular Not Present- Chest Pain, Difficulty Breathing Lying Down, Murmur, Palpitations, Racing/skipping heartbeats and Swelling. Gastrointestinal Not Present- Abdominal Pain, Bloody Stool, Constipation, Diarrhea, Difficulty Swallowing, Heartburn, Jaundice, Loss of appetitie, Nausea and Vomiting. Male Genitourinary Not Present- Blood in Urine, Discharge, Flank Pain, Incontinence, Painful Urination, Urgency, Urinary frequency, Urinary Retention, Urinating at Night and Weak urinary stream. Musculoskeletal Present- Joint Pain. Not Present- Back Pain, Joint Swelling, Morning Stiffness, Muscle Pain, Muscle Weakness and Spasms. Neurological Not Present- Blackout spells, Difficulty with balance, Dizziness, Paralysis, Tremor and Weakness. Psychiatric Not Present- Insomnia.  Vitals  Weight: 71 lb Height: 178in Body Surface Area: 2.64 m Body Mass Index: 1.58 kg/m  Pulse: 62 (Regular)  BP: 104/72 (Sitting, Right Arm, Standard)  Physical Exam General Mental Status -Alert, cooperative and good historian. General Appearance-pleasant, Not in acute distress. Orientation-Oriented X3. Build & Nutrition-Well nourished and Well developed.  Head and Neck Head-normocephalic, atraumatic . Neck Global Assessment - supple, no bruit auscultated on the right, no bruit auscultated on the left.  Eye Vision-Wears corrective lenses. Pupil - Bilateral-Regular and Round. Motion - Bilateral-EOMI.  Chest and Lung Exam Auscultation Breath sounds - clear at anterior chest wall and clear at posterior chest wall. Adventitious sounds - No Adventitious sounds.  Cardiovascular Auscultation Rhythm - Regular rate and rhythm. Heart Sounds - S1 WNL and S2 WNL. Murmurs & Other Heart Sounds - Auscultation of the heart reveals - No Murmurs.  Abdomen Palpation/Percussion Tenderness - Abdomen is non-tender to palpation. Rigidity (guarding) - Abdomen is  soft. Auscultation Auscultation of the abdomen reveals - Bowel sounds normal.  Male Genitourinary Note: Not done, not pertinent to present illness   Musculoskeletal Note: On exam, he is alert and oriented, in no apparent distress. Evaluation of his left hip shows normal range of motion with no discomfort. Right hip shows flexion to about 90, no internal rotation, about 10 to 20 degrees of external rotation and 20 degrees of abduction. Right knee exam is normal. Pulse, sensation and motor are intact in both lower extremities. He has a significantly antalgic gait pattern on the right.  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 He has got focal bone-on-bone contact superolaterally and inferomedially. He has subchondral cystic changes and osteophyte formation in that hip. He has very mild dysplasia on the left and no degenerative change on the left.   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 Caryl NeverBurchette  IV TXA  Anesthesia Issues: none  Signed electronically by Beckey RutterAlezandrew L Fount Bahe, III PA-C

## 2015-11-29 NOTE — Progress Notes (Signed)
Pt aware of surgical time for Monday 12/02/2015. [facility] am and reviewed no food after midnight but can have clear liquid diet till 8 am then nothing by mouth. Pt has instructions on clear liquid diet.

## 2015-12-02 ENCOUNTER — Inpatient Hospital Stay (HOSPITAL_COMMUNITY): Payer: BC Managed Care – PPO | Admitting: Anesthesiology

## 2015-12-02 ENCOUNTER — Inpatient Hospital Stay (HOSPITAL_COMMUNITY): Payer: BC Managed Care – PPO

## 2015-12-02 ENCOUNTER — Inpatient Hospital Stay (HOSPITAL_COMMUNITY)
Admission: RE | Admit: 2015-12-02 | Discharge: 2015-12-03 | DRG: 470 | Disposition: A | Discharge status: Home Health | Payer: BC Managed Care – PPO | Source: Ambulatory Visit | Attending: Orthopedic Surgery | Admitting: Orthopedic Surgery

## 2015-12-02 ENCOUNTER — Encounter (HOSPITAL_COMMUNITY): Payer: Self-pay | Admitting: *Deleted

## 2015-12-02 ENCOUNTER — Encounter (HOSPITAL_COMMUNITY)
Admission: RE | Disposition: A | Discharge status: Home Health | Payer: Self-pay | Source: Ambulatory Visit | Attending: Orthopedic Surgery

## 2015-12-02 DIAGNOSIS — M1611 Unilateral primary osteoarthritis, right hip: Secondary | ICD-10-CM

## 2015-12-02 DIAGNOSIS — Z96649 Presence of unspecified artificial hip joint: Secondary | ICD-10-CM

## 2015-12-02 DIAGNOSIS — M1631 Unilateral osteoarthritis resulting from hip dysplasia, right hip: Secondary | ICD-10-CM | POA: Diagnosis present

## 2015-12-02 DIAGNOSIS — M25551 Pain in right hip: Secondary | ICD-10-CM

## 2015-12-02 DIAGNOSIS — Z87891 Personal history of nicotine dependence: Secondary | ICD-10-CM

## 2015-12-02 DIAGNOSIS — M169 Osteoarthritis of hip, unspecified: Secondary | ICD-10-CM | POA: Diagnosis present

## 2015-12-02 HISTORY — PX: TOTAL HIP ARTHROPLASTY: SHX124

## 2015-12-02 SURGERY — ARTHROPLASTY, HIP, TOTAL, ANTERIOR APPROACH
Anesthesia: Spinal | Site: Hip | Laterality: Right

## 2015-12-02 MED ORDER — RIVAROXABAN 10 MG PO TABS
10.0000 mg | ORAL_TABLET | Freq: Every day | ORAL | Status: DC
  Administered 2015-12-03: 10 mg via ORAL
  Filled 2015-12-02: qty 1

## 2015-12-02 MED ORDER — DEXAMETHASONE SODIUM PHOSPHATE 10 MG/ML IJ SOLN
10.0000 mg | Freq: Once | INTRAMUSCULAR | Status: AC
  Administered 2015-12-03: 10 mg via INTRAVENOUS
  Filled 2015-12-02: qty 1

## 2015-12-02 MED ORDER — ONDANSETRON HCL 4 MG/2ML IJ SOLN
INTRAMUSCULAR | Status: AC
  Filled 2015-12-02: qty 2

## 2015-12-02 MED ORDER — CEFAZOLIN SODIUM-DEXTROSE 2-4 GM/100ML-% IV SOLN
2.0000 g | INTRAVENOUS | Status: AC
  Administered 2015-12-02: 2 g via INTRAVENOUS

## 2015-12-02 MED ORDER — MIDAZOLAM HCL 2 MG/2ML IJ SOLN
INTRAMUSCULAR | Status: AC
  Filled 2015-12-02: qty 2

## 2015-12-02 MED ORDER — SODIUM CHLORIDE 0.9 % IV SOLN: INTRAVENOUS | Status: DC

## 2015-12-02 MED ORDER — METOCLOPRAMIDE HCL 5 MG PO TABS: 5.0000 mg | ORAL_TABLET | Freq: Three times a day (TID) | ORAL | Status: DC | PRN

## 2015-12-02 MED ORDER — PHENOL 1.4 % MT LIQD
1.0000 | OROMUCOSAL | Status: DC | PRN
Start: 2015-12-02 — End: 2015-12-03

## 2015-12-02 MED ORDER — DEXAMETHASONE SODIUM PHOSPHATE 10 MG/ML IJ SOLN
10.0000 mg | Freq: Once | INTRAMUSCULAR | Status: AC
  Administered 2015-12-02: 10 mg via INTRAVENOUS

## 2015-12-02 MED ORDER — TRANEXAMIC ACID 1000 MG/10ML IV SOLN
1000.0000 mg | INTRAVENOUS | Status: AC
  Administered 2015-12-02: 1000 mg via INTRAVENOUS
  Filled 2015-12-02: qty 10

## 2015-12-02 MED ORDER — ONDANSETRON HCL 4 MG/2ML IJ SOLN
INTRAMUSCULAR | Status: DC | PRN
  Administered 2015-12-02: 4 mg via INTRAVENOUS

## 2015-12-02 MED ORDER — CHLORHEXIDINE GLUCONATE 4 % EX LIQD: 60.0000 mL | Freq: Once | CUTANEOUS | Status: DC

## 2015-12-02 MED ORDER — PROPOFOL 500 MG/50ML IV EMUL
INTRAVENOUS | Status: DC | PRN
  Administered 2015-12-02: 75 ug/kg/min via INTRAVENOUS

## 2015-12-02 MED ORDER — PHENYLEPHRINE HCL 10 MG/ML IJ SOLN
INTRAMUSCULAR | Status: DC | PRN
  Administered 2015-12-02: 40 ug via INTRAVENOUS
  Administered 2015-12-02 (×7): 80 ug via INTRAVENOUS

## 2015-12-02 MED ORDER — FENTANYL CITRATE (PF) 100 MCG/2ML IJ SOLN
INTRAMUSCULAR | Status: AC
  Filled 2015-12-02: qty 2

## 2015-12-02 MED ORDER — TRANEXAMIC ACID 1000 MG/10ML IV SOLN
1000.0000 mg | Freq: Once | INTRAVENOUS | Status: AC
  Administered 2015-12-02: 1000 mg via INTRAVENOUS
  Filled 2015-12-02 (×2): qty 10

## 2015-12-02 MED ORDER — MIDAZOLAM HCL 5 MG/5ML IJ SOLN
INTRAMUSCULAR | Status: DC | PRN
  Administered 2015-12-02: 2 mg via INTRAVENOUS

## 2015-12-02 MED ORDER — CEFAZOLIN SODIUM-DEXTROSE 2-4 GM/100ML-% IV SOLN
INTRAVENOUS | Status: AC
  Filled 2015-12-02: qty 100

## 2015-12-02 MED ORDER — ACETAMINOPHEN 10 MG/ML IV SOLN
1000.0000 mg | Freq: Once | INTRAVENOUS | Status: AC
  Administered 2015-12-02: 1000 mg via INTRAVENOUS

## 2015-12-02 MED ORDER — PROPOFOL 10 MG/ML IV BOLUS
INTRAVENOUS | Status: DC | PRN
  Administered 2015-12-02: 40 mg via INTRAVENOUS

## 2015-12-02 MED ORDER — BISACODYL 10 MG RE SUPP: 10.0000 mg | Freq: Every day | RECTAL | Status: DC | PRN

## 2015-12-02 MED ORDER — METHOCARBAMOL 1000 MG/10ML IJ SOLN
500.0000 mg | Freq: Four times a day (QID) | INTRAVENOUS | Status: DC | PRN
  Administered 2015-12-02: 500 mg via INTRAVENOUS
  Filled 2015-12-02: qty 5
  Filled 2015-12-02: qty 550

## 2015-12-02 MED ORDER — BUPIVACAINE HCL (PF) 0.25 % IJ SOLN
INTRAMUSCULAR | Status: DC | PRN
  Administered 2015-12-02: 30 mL

## 2015-12-02 MED ORDER — FLUTICASONE PROPIONATE 50 MCG/ACT NA SUSP
1.0000 | Freq: Every day | NASAL | Status: DC
  Administered 2015-12-02: 1 via NASAL
  Filled 2015-12-02: qty 16

## 2015-12-02 MED ORDER — LACTATED RINGERS IV SOLN
INTRAVENOUS | Status: DC | PRN
  Administered 2015-12-02 (×3): via INTRAVENOUS

## 2015-12-02 MED ORDER — FLEET ENEMA 7-19 GM/118ML RE ENEM: 1.0000 | ENEMA | Freq: Once | RECTAL | Status: DC | PRN

## 2015-12-02 MED ORDER — METOCLOPRAMIDE HCL 5 MG/ML IJ SOLN: 5.0000 mg | Freq: Three times a day (TID) | INTRAMUSCULAR | Status: DC | PRN

## 2015-12-02 MED ORDER — ACETAMINOPHEN 650 MG RE SUPP: 650.0000 mg | Freq: Four times a day (QID) | RECTAL | Status: DC | PRN

## 2015-12-02 MED ORDER — ACETAMINOPHEN 10 MG/ML IV SOLN
INTRAVENOUS | Status: AC
  Filled 2015-12-02: qty 100

## 2015-12-02 MED ORDER — ACETAMINOPHEN 500 MG PO TABS
1000.0000 mg | ORAL_TABLET | Freq: Four times a day (QID) | ORAL | Status: AC
  Administered 2015-12-02 – 2015-12-03 (×4): 1000 mg via ORAL
  Filled 2015-12-02 (×4): qty 2

## 2015-12-02 MED ORDER — DOCUSATE SODIUM 100 MG PO CAPS
100.0000 mg | ORAL_CAPSULE | Freq: Two times a day (BID) | ORAL | Status: DC
  Administered 2015-12-02 – 2015-12-03 (×2): 100 mg via ORAL
  Filled 2015-12-02 (×2): qty 1

## 2015-12-02 MED ORDER — BUPIVACAINE HCL (PF) 0.75 % IJ SOLN
INTRAMUSCULAR | Status: DC | PRN
  Administered 2015-12-02: 2 mL via INTRATHECAL

## 2015-12-02 MED ORDER — ONDANSETRON HCL 4 MG/2ML IJ SOLN: 4.0000 mg | Freq: Four times a day (QID) | INTRAMUSCULAR | Status: DC | PRN

## 2015-12-02 MED ORDER — ACETAMINOPHEN 325 MG PO TABS: 650.0000 mg | ORAL_TABLET | Freq: Four times a day (QID) | ORAL | Status: DC | PRN

## 2015-12-02 MED ORDER — KETOROLAC TROMETHAMINE 15 MG/ML IJ SOLN
7.5000 mg | Freq: Four times a day (QID) | INTRAMUSCULAR | Status: AC | PRN
Start: 2015-12-02 — End: 2015-12-03
  Administered 2015-12-02: 7.5 mg via INTRAVENOUS
  Filled 2015-12-02: qty 1

## 2015-12-02 MED ORDER — SODIUM CHLORIDE 0.9 % IV SOLN
INTRAVENOUS | Status: DC
  Administered 2015-12-02: 18:00:00 via INTRAVENOUS

## 2015-12-02 MED ORDER — POLYETHYLENE GLYCOL 3350 17 G PO PACK: 17.0000 g | PACK | Freq: Every day | ORAL | Status: DC | PRN

## 2015-12-02 MED ORDER — ONDANSETRON HCL 4 MG PO TABS: 4.0000 mg | ORAL_TABLET | Freq: Four times a day (QID) | ORAL | Status: DC | PRN

## 2015-12-02 MED ORDER — MENTHOL 3 MG MT LOZG: 1.0000 | LOZENGE | OROMUCOSAL | Status: DC | PRN

## 2015-12-02 MED ORDER — HYDROMORPHONE HCL 1 MG/ML IJ SOLN: 0.2500 mg | INTRAMUSCULAR | Status: DC | PRN

## 2015-12-02 MED ORDER — FENTANYL CITRATE (PF) 100 MCG/2ML IJ SOLN
INTRAMUSCULAR | Status: DC | PRN
  Administered 2015-12-02: 25 ug via INTRAVENOUS
  Administered 2015-12-02: 75 ug via INTRAVENOUS

## 2015-12-02 MED ORDER — DEXAMETHASONE SODIUM PHOSPHATE 10 MG/ML IJ SOLN
INTRAMUSCULAR | Status: AC
  Filled 2015-12-02: qty 1

## 2015-12-02 MED ORDER — BUPIVACAINE HCL (PF) 0.25 % IJ SOLN
INTRAMUSCULAR | Status: AC
  Filled 2015-12-02: qty 30

## 2015-12-02 MED ORDER — DIPHENHYDRAMINE HCL 12.5 MG/5ML PO ELIX: 12.5000 mg | ORAL_SOLUTION | ORAL | Status: DC | PRN

## 2015-12-02 MED ORDER — GLYCOPYRROLATE 0.2 MG/ML IJ SOLN
INTRAMUSCULAR | Status: DC | PRN
  Administered 2015-12-02 (×2): 0.1 mg via INTRAVENOUS

## 2015-12-02 MED ORDER — OXYCODONE HCL 5 MG PO TABS
5.0000 mg | ORAL_TABLET | ORAL | Status: DC | PRN
  Administered 2015-12-02: 5 mg via ORAL
  Administered 2015-12-02 – 2015-12-03 (×2): 10 mg via ORAL
  Administered 2015-12-03 (×2): 5 mg via ORAL
  Administered 2015-12-03: 10 mg via ORAL
  Filled 2015-12-02: qty 1
  Filled 2015-12-02 (×4): qty 2

## 2015-12-02 MED ORDER — AZELASTINE HCL 0.1 % NA SOLN
1.0000 | Freq: Every day | NASAL | Status: DC
  Administered 2015-12-02: 1 via NASAL
  Filled 2015-12-02: qty 30

## 2015-12-02 MED ORDER — PROPOFOL 10 MG/ML IV BOLUS
INTRAVENOUS | Status: AC
  Filled 2015-12-02: qty 20

## 2015-12-02 MED ORDER — METHOCARBAMOL 500 MG PO TABS
500.0000 mg | ORAL_TABLET | Freq: Four times a day (QID) | ORAL | Status: DC | PRN
  Filled 2015-12-02: qty 1

## 2015-12-02 MED ORDER — CEFAZOLIN SODIUM-DEXTROSE 2-4 GM/100ML-% IV SOLN
2.0000 g | Freq: Four times a day (QID) | INTRAVENOUS | Status: AC
  Administered 2015-12-02 – 2015-12-03 (×2): 2 g via INTRAVENOUS
  Filled 2015-12-02 (×2): qty 100

## 2015-12-02 MED ORDER — MORPHINE SULFATE (PF) 2 MG/ML IV SOLN: 1.0000 mg | INTRAVENOUS | Status: DC | PRN

## 2015-12-02 MED ORDER — TRAMADOL HCL 50 MG PO TABS: 50.0000 mg | ORAL_TABLET | Freq: Four times a day (QID) | ORAL | Status: DC | PRN

## 2015-12-02 MED ORDER — PHENYLEPHRINE 40 MCG/ML (10ML) SYRINGE FOR IV PUSH (FOR BLOOD PRESSURE SUPPORT)
PREFILLED_SYRINGE | INTRAVENOUS | Status: AC
  Filled 2015-12-02: qty 20

## 2015-12-02 SURGICAL SUPPLY — 34 items
BAG DECANTER FOR FLEXI CONT (MISCELLANEOUS) IMPLANT
BAG ZIPLOCK 12X15 (MISCELLANEOUS) IMPLANT
BLADE SAG 18X100X1.27 (BLADE) ×3 IMPLANT
CAPT HIP TOTAL 2 ×3 IMPLANT
CLOSURE WOUND 1/2 X4 (GAUZE/BANDAGES/DRESSINGS) ×2
CLOTH BEACON ORANGE TIMEOUT ST (SAFETY) ×3 IMPLANT
COVER PERINEAL POST (MISCELLANEOUS) ×3 IMPLANT
DECANTER SPIKE VIAL GLASS SM (MISCELLANEOUS) ×3 IMPLANT
DRAPE STERI IOBAN 125X83 (DRAPES) ×3 IMPLANT
DRAPE U-SHAPE 47X51 STRL (DRAPES) ×6 IMPLANT
DRSG ADAPTIC 3X8 NADH LF (GAUZE/BANDAGES/DRESSINGS) ×3 IMPLANT
DRSG MEPILEX BORDER 4X4 (GAUZE/BANDAGES/DRESSINGS) ×3 IMPLANT
DRSG MEPILEX BORDER 4X8 (GAUZE/BANDAGES/DRESSINGS) ×3 IMPLANT
DURAPREP 26ML APPLICATOR (WOUND CARE) ×3 IMPLANT
ELECT REM PT RETURN 9FT ADLT (ELECTROSURGICAL) ×3
ELECTRODE REM PT RTRN 9FT ADLT (ELECTROSURGICAL) ×1 IMPLANT
EVACUATOR 1/8 PVC DRAIN (DRAIN) ×3 IMPLANT
GLOVE BIO SURGEON STRL SZ7.5 (GLOVE) ×3 IMPLANT
GLOVE BIO SURGEON STRL SZ8 (GLOVE) ×6 IMPLANT
GLOVE BIOGEL PI IND STRL 8 (GLOVE) ×2 IMPLANT
GLOVE BIOGEL PI INDICATOR 8 (GLOVE) ×4
GOWN STRL REUS W/TWL LRG LVL3 (GOWN DISPOSABLE) ×3 IMPLANT
GOWN STRL REUS W/TWL XL LVL3 (GOWN DISPOSABLE) ×3 IMPLANT
PACK ANTERIOR HIP CUSTOM (KITS) ×3 IMPLANT
STRIP CLOSURE SKIN 1/2X4 (GAUZE/BANDAGES/DRESSINGS) ×4 IMPLANT
SUT ETHIBOND NAB CT1 #1 30IN (SUTURE) ×3 IMPLANT
SUT MNCRL AB 4-0 PS2 18 (SUTURE) ×3 IMPLANT
SUT VIC AB 2-0 CT1 27 (SUTURE) ×4
SUT VIC AB 2-0 CT1 TAPERPNT 27 (SUTURE) ×2 IMPLANT
SUT VLOC 180 0 24IN GS25 (SUTURE) ×3 IMPLANT
SYR 50ML LL SCALE MARK (SYRINGE) IMPLANT
TRAY FOLEY W/METER SILVER 14FR (SET/KITS/TRAYS/PACK) IMPLANT
TRAY FOLEY W/METER SILVER 16FR (SET/KITS/TRAYS/PACK) ×3 IMPLANT
YANKAUER SUCT BULB TIP 10FT TU (MISCELLANEOUS) ×3 IMPLANT

## 2015-12-02 NOTE — Op Note (Signed)
OPERATIVE REPORT  PREOPERATIVE DIAGNOSIS: Osteoarthritis of the Right hip.   POSTOPERATIVE DIAGNOSIS: Osteoarthritis of the Right  hip.   PROCEDURE: Right total hip arthroplasty, anterior approach.   SURGEON: Ollen Gross, MD   ASSISTANT: Avel Peace, PA-C  ANESTHESIA:  Spinal  ESTIMATED BLOOD LOSS:-500 ml  DRAINS: Hemovac x1.   COMPLICATIONS: None   CONDITION: PACU - hemodynamically stable.   BRIEF CLINICAL NOTE: Edwin [name] is a 60 y.o. male who has advanced end-  stage arthritis of his Right  hip with progressively worsening pain and  dysfunction.The patient has failed nonoperative management and presents for  total hip arthroplasty.   PROCEDURE IN DETAIL: After successful administration of spinal  anesthetic, the traction boots for the [facility] bed were placed on both  feet and the patient was placed onto the [facility] bed, boots placed into the leg  holders. The Right hip was then isolated from the perineum with plastic  drapes and prepped and draped in the usual sterile fashion. ASIS and  greater trochanter were marked and a oblique incision was made, starting  at about 1 cm lateral and 2 cm distal to the ASIS and coursing towards  the anterior cortex of the femur. The skin was cut with a 10 blade  through subcutaneous tissue to the level of the fascia overlying the  tensor fascia lata muscle. The fascia was then incised in line with the  incision at the junction of the anterior third and posterior 2/3rd. The  muscle was teased off the fascia and then the interval between the TFL  and the rectus was developed. The Hohmann retractor was then placed at  the top of the femoral neck over the capsule. The vessels overlying the  capsule were cauterized and the fat on top of the capsule was removed.  A Hohmann retractor was then placed anterior underneath the rectus  femoris to give exposure to the entire anterior capsule. A T-shaped  capsulotomy was performed.  The edges were tagged and the femoral head  was identified.       Osteophytes are removed off the superior acetabulum.  The femoral neck was then cut in situ with an oscillating saw. Traction  was then applied to the left lower extremity utilizing the [facility]  traction. The femoral head was then removed. Retractors were placed  around the acetabulum and then circumferential removal of the labrum was  performed. Osteophytes were also removed. Reaming starts at 47 mm to  medialize and  Increased in 2 mm increments to 53 mm. We reamed in  approximately 40 degrees of abduction, 20 degrees anteversion. A 54 mm  pinnacle acetabular shell was then impacted in anatomic position under  fluoroscopic guidance with excellent purchase. We did not need to place  any additional dome screws. A 36 mm neutral + 4 marathon liner was then  placed into the acetabular shell.       The femoral lift was then placed along the lateral aspect of the femur  just distal to the vastus ridge. The leg was  externally rotated and capsule  was stripped off the inferior aspect of the femoral neck down to the  level of the lesser trochanter, this was done with electrocautery. The femur was lifted after this was performed. The  leg was then placed and extended in adducted position to essentially delivering the femur. We also removed the capsule superiorly and the  piriformis from the piriformis fossa  to gain excellent exposure of the  proximal femur. Rongeur was used to remove some cancellous bone to get  into the lateral portion of the proximal femur for placement of the  initial starter reamer. The starter broaches was placed  the starter broach  and was shown to go down the center of the canal. Broaching  with the  Corail system was then performed starting at size 8, coursing  Up to size 12. A size 12 had excellent torsional and rotational  and axial stability. The trial high offset neck was then placed  with a 36 + 1.5 trial  head. The hip was then reduced. We confirmed that  the stem was in the canal both on AP and lateral x-rays. It also has excellent sizing. The hip was reduced with outstanding stability through full extension, full external rotation,  and then flexion in adduction internal rotation. AP pelvis was taken  and the leg lengths were measured and found to be exactly equal. Hip  was then dislocated again and the femoral head and neck removed. The  femoral broach was removed. Size 12 Corail stem with a high offset  neck was then impacted into the femur following native anteversion. Has  excellent purchase in the canal. Excellent torsional and rotational and  axial stability. It is confirmed to be in the canal on AP and lateral  fluoroscopic views. The 36 + 1.5 ceramic head was placed and the hip  reduced with outstanding stability. Again AP pelvis was taken and it  confirmed that the leg lengths were equal. The wound was then copiously  irrigated with saline solution and the capsule reattached and repaired  with Ethibond suture. 30 ml of .25% Bupivicaine injected into the capsule and into the edge of the tensor fascia lata as well as subcutaneous tissue. The fascia overlying the tensor fascia lata was  then closed with a running #1 V-Loc. Subcu was closed with interrupted  2-0 Vicryl and subcuticular running 4-0 Monocryl. Incision was cleaned  and dried. Steri-Strips and a bulky sterile dressing applied. Hemovac  drain was hooked to suction and then he was awakened and transported to  recovery in stable condition.        Please note that a surgical assistant was a medical necessity for this procedure to perform it in a safe and expeditious manner. Assistant was necessary to provide appropriate retraction of vital neurovascular structures and to prevent femoral fracture and allow for anatomic placement of the prosthesis.  Ollen GrossFrank Elliott Lasecki, M.D.

## 2015-12-02 NOTE — Anesthesia Procedure Notes (Signed)
Spinal Patient location during procedure: OR Start time: 12/02/2015 2:50 PM End time: 12/02/2015 2:52 PM Staffing Anesthesiologist: Delson Dulworth Performed by: anesthesiologist  Preanesthetic Checklist Completed: patient identified, site marked, surgical consent, pre-op evaluation, timeout performed, IV checked, risks and benefits discussed and monitors and equipment checked Spinal Block Patient position: sitting Prep: Betadine Patient monitoring: heart rate, cardiac monitor, continuous pulse ox and blood pressure Approach: midline Location: L3-4 Needle Needle type: Sprotte  Needle gauge: 24 G Needle length: 9 cm Needle insertion depth: 5 cm Assessment Sensory level: T6 Additional Notes Tolerated well

## 2015-12-02 NOTE — Interval H&P Note (Signed)
History and Physical Interval Note:  12/02/2015 2:34 PM  Edwin SarnaStuart J Grisanti  has presented today for surgery, with the diagnosis of Right Hip Osteoarthritis  The various methods of treatment have been discussed with the patient and family. After consideration of risks, benefits and other options for treatment, the patient has consented to  Procedure(s): RIGHT TOTAL HIP ARTHROPLASTY ANTERIOR APPROACH (Right) as a surgical intervention .  The patient's history has been reviewed, patient examined, no change in status, stable for surgery.  I have reviewed the patient's chart and labs.  Questions were answered to the patient's satisfaction.     Loanne DrillingALUISIO,Ziair Penson V

## 2015-12-02 NOTE — H&P (View-Only) (Signed)
Edwin Sandt PHd DOB: 27-Jan-1956 Married / Language: English / Race: White Male  Date of Admission:  12/02/2015  CC:  Right hip pain  History of Present Illness The patient is a 60 year old male who comes in for a preoperative History and Physical. The patient is scheduled for a right total hip arthroplasty (anterior) to be performed by Dr. Gus Rankin. Aluisio, MD at [facility] on 12-02-2015. The patient reports right hip problems including pain symptoms that have been present for 1 year(s) (4months). The symptoms began without any known injury (Patient states that he started having pain in his groin over a year ago. He saw a sports medicine doctor and had xrays. He has been doing home exercises as instructed. About three weeks ago his pain increased and has bothered him even with resting. He enjoys playing tennis, but has not been able to do that. He taking Motrin occasionally. He brought a copy of his x-rays with him today.). [place] has had problems with his right hip now for a few years. For the past year, it has gotten progressively worse. He has pain in his groin and anterior thigh radiating now to his knee. He has had a significant loss of range of motion. He says he is limping all the time. He tries to remain very active, but when he does activities such as tennis, he has a marked increase in pain and is now doing a lot less than he was previously. He is at a stage where the right hip is having a very negative effect on his life. He is not having any left hip pain. He is not having any back pain, lower extremity weakness or paresthesia. Recently he has also started developing right hip pain at night and it is making it more difficult for him to sleep. He said he had x-rays done about a year ago, and was told he had some arthritis at that point. He was seen in the clinic and felt that he would benefit from undergoing a total hip arthroplasty. They have been treated conservatively in the  past for the above stated problem and despite conservative measures, they continue to have progressive pain and severe functional limitations and dysfunction. They have failed non-operative management including home exercise, medications. It is felt that they would benefit from undergoing total joint replacement. Risks and benefits of the procedure have been discussed with the patient and they elect to proceed with surgery. There are no active contraindications to surgery such as ongoing infection or rapidly progressive neurological disease.  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)   Allergies No Known Drug Allergies  Allergies Reconciled   Family History  Family history unknown - Adopted  First Degree Relatives  reported  Social History Children  2 Current drinker  10/15/2015: Currently drinks beer 5-7 times per week Current work status  working full time Exercise  Exercises daily; does running / walking, individual sport and gym / [employer] Living situation  live with spouse Marital status  married No history of drug/alcohol rehab  Not under pain contract  Number of flights of stairs before winded  greater than 5 Tobacco / smoke exposure  10/15/2015: no Tobacco use  10/15/2015: never smoker Advance Directives  Living Will, Healthcare POA Post-Surgical Plans  Home with Wife  Medication History Fluticasone Propionate (50MCG/ACT Suspension, Nasal) Active.  Past Surgical History Arthroscopy of Knee  left Vasectomy    Review of Systems  General Not  Present- Chills, Fatigue, Fever, Memory Loss, Night Sweats, Weight Gain and Weight Loss. Skin Not Present- Eczema, Hives, Itching, Lesions and Rash. HEENT Not Present- Dentures, Double Vision, Headache, Hearing Loss, Tinnitus and Visual Loss. Respiratory Not Present- Allergies, Chronic Cough, Coughing up blood, Shortness of breath at rest and Shortness of breath with  exertion. Cardiovascular Not Present- Chest Pain, Difficulty Breathing Lying Down, Murmur, Palpitations, Racing/skipping heartbeats and Swelling. Gastrointestinal Not Present- Abdominal Pain, Bloody Stool, Constipation, Diarrhea, Difficulty Swallowing, Heartburn, Jaundice, Loss of appetitie, Nausea and Vomiting. Male Genitourinary Not Present- Blood in Urine, Discharge, Flank Pain, Incontinence, Painful Urination, Urgency, Urinary frequency, Urinary Retention, Urinating at Night and Weak urinary stream. Musculoskeletal Present- Joint Pain. Not Present- Back Pain, Joint Swelling, Morning Stiffness, Muscle Pain, Muscle Weakness and Spasms. Neurological Not Present- Blackout spells, Difficulty with balance, Dizziness, Paralysis, Tremor and Weakness. Psychiatric Not Present- Insomnia.  Vitals  Weight: 71 lb Height: 178in Body Surface Area: 2.64 m Body Mass Index: 1.58 kg/m  Pulse: 62 (Regular)  BP: 104/72 (Sitting, Right Arm, Standard)  Physical Exam General Mental Status -Alert, cooperative and good historian. General Appearance-pleasant, Not in acute distress. Orientation-Oriented X3. Build & Nutrition-Well nourished and Well developed.  Head and Neck Head-normocephalic, atraumatic . Neck Global Assessment - supple, no bruit auscultated on the right, no bruit auscultated on the left.  Eye Vision-Wears corrective lenses. Pupil - Bilateral-Regular and Round. Motion - Bilateral-EOMI.  Chest and Lung Exam Auscultation Breath sounds - clear at anterior chest wall and clear at posterior chest wall. Adventitious sounds - No Adventitious sounds.  Cardiovascular Auscultation Rhythm - Regular rate and rhythm. Heart Sounds - S1 WNL and S2 WNL. Murmurs & Other Heart Sounds - Auscultation of the heart reveals - No Murmurs.  Abdomen Palpation/Percussion Tenderness - Abdomen is non-tender to palpation. Rigidity (guarding) - Abdomen is  soft. Auscultation Auscultation of the abdomen reveals - Bowel sounds normal.  Male Genitourinary Note: Not done, not pertinent to present illness   Musculoskeletal Note: On exam, he is alert and oriented, in no apparent distress. Evaluation of his left hip shows normal range of motion with no discomfort. Right hip shows flexion to about 90, no internal rotation, about 10 to 20 degrees of external rotation and 20 degrees of abduction. Right knee exam is normal. Pulse, sensation and motor are intact in both lower extremities. He has a significantly antalgic gait pattern on the right.  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 He has got focal bone-on-bone contact superolaterally and inferomedially. He has subchondral cystic changes and osteophyte formation in that hip. He has very mild dysplasia on the left and no degenerative change on the left.   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 Caryl NeverBurchette  IV TXA  Anesthesia Issues: none  Signed electronically by Beckey RutterAlezandrew L Haizlee Henton, III PA-C

## 2015-12-02 NOTE — Anesthesia Preprocedure Evaluation (Addendum)
Anesthesia Evaluation  Patient identified by MRN, date of birth, ID band Patient awake    Reviewed: Allergy & Precautions, NPO status , Patient's Chart, lab work & pertinent test results  Airway Mallampati: I  TM Distance: >3 FB Neck ROM: Full    Dental  (+) Teeth Intact   Pulmonary former smoker,    breath sounds clear to auscultation       Cardiovascular + angina  Rhythm:Regular Rate:Normal     Neuro/Psych negative neurological ROS     GI/Hepatic negative GI ROS,   Endo/Other  negative endocrine ROS  Renal/GU negative Renal ROS     Musculoskeletal  (+) Arthritis ,   Abdominal   Peds  Hematology negative hematology ROS (+)   Anesthesia Other Findings   Reproductive/Obstetrics                            Anesthesia Physical Anesthesia Plan  ASA: II  Anesthesia Plan: Spinal   Post-op Pain Management:    Induction:   Airway Management Planned: Natural Airway and Simple Face Mask  Additional Equipment:   Intra-op Plan:   Post-operative Plan: Extubation in OR  Informed Consent: I have reviewed the patients History and Physical, chart, labs and discussed the procedure including the risks, benefits and alternatives for the proposed anesthesia with the patient or authorized representative who has indicated his/her understanding and acceptance.   Dental advisory given  Plan Discussed with: CRNA and Surgeon  Anesthesia Plan Comments:         Anesthesia Quick Evaluation

## 2015-12-02 NOTE — Anesthesia Postprocedure Evaluation (Signed)
Anesthesia Post Note  Patient: Edwin [name]  Procedure(s) Performed: Procedure(s) (LRB): RIGHT TOTAL HIP ARTHROPLASTY ANTERIOR APPROACH (Right)  Patient location during evaluation: PACU Anesthesia Type: Spinal Level of consciousness: oriented and awake and alert Pain management: pain level controlled Vital Signs Assessment: post-procedure vital signs reviewed and stable Respiratory status: spontaneous breathing, respiratory function stable and patient connected to nasal cannula oxygen Cardiovascular status: blood pressure returned to baseline and stable Postop Assessment: no headache, no backache and spinal receding Anesthetic complications: no    Last Vitals:  Filed Vitals:   12/02/15 1827 12/02/15 1932  BP: 126/58 108/68  Pulse: 67 52  Temp: 36.4 C 36.9 C  Resp: 14 16    Last Pain:  Filed Vitals:   12/02/15 1934  PainSc: 3                  Hailley Byers,JAMES TERRILL

## 2015-12-02 NOTE — Transfer of Care (Signed)
Immediate Anesthesia Transfer of Care Note  Patient: Edwin [name]  Procedure(s) Performed: Procedure(s): RIGHT TOTAL HIP ARTHROPLASTY ANTERIOR APPROACH (Right)  Patient Location: PACU  Anesthesia Type:Spinal  Level of Consciousness:  sedated, patient cooperative and responds to stimulation  Airway & Oxygen Therapy:Patient Spontanous Breathing and Patient connected to face mask oxgen  Post-op Assessment:  Report given to PACU RN and Post -op Vital signs reviewed and stable  Post vital signs:  Reviewed and stable  Last Vitals:  Filed Vitals:   12/02/15 1150  BP: 121/75  Pulse: 53  Temp: 36.9 C  Resp: 16    Complications: No apparent anesthesia complications

## 2015-12-03 LAB — CBC
HEMATOCRIT: 36.3 % — AB (ref 39.0–52.0)
Hemoglobin: 12.6 g/dL — ABNORMAL LOW (ref 13.0–17.0)
MCH: 33 pg (ref 26.0–34.0)
MCHC: 34.7 g/dL (ref 30.0–36.0)
MCV: 95 fL (ref 78.0–100.0)
Platelets: 159 10*3/uL (ref 150–400)
RBC: 3.82 MIL/uL — AB (ref 4.22–5.81)
RDW: 12.9 % (ref 11.5–15.5)
WBC: 10 10*3/uL (ref 4.0–10.5)

## 2015-12-03 LAB — BASIC METABOLIC PANEL
ANION GAP: 7 (ref 5–15)
BUN: 8 mg/dL (ref 6–20)
CHLORIDE: 104 mmol/L (ref 101–111)
CO2: 26 mmol/L (ref 22–32)
Calcium: 8.5 mg/dL — ABNORMAL LOW (ref 8.9–10.3)
Creatinine, Ser: 0.87 mg/dL (ref 0.61–1.24)
GFR calc non Af Amer: >60 mL/min (ref >60)
GLUCOSE: 158 mg/dL — AB (ref 65–99)
POTASSIUM: 3.9 mmol/L (ref 3.5–5.1)
Sodium: 137 mmol/L (ref 135–145)

## 2015-12-03 MED ORDER — METHOCARBAMOL 500 MG PO TABS: 500.0000 mg | ORAL_TABLET | Freq: Four times a day (QID) | ORAL | Status: DC | PRN

## 2015-12-03 MED ORDER — TRAMADOL HCL 50 MG PO TABS: 50.0000 mg | ORAL_TABLET | Freq: Four times a day (QID) | ORAL | Status: DC | PRN

## 2015-12-03 MED ORDER — RIVAROXABAN 10 MG PO TABS: 10.0000 mg | ORAL_TABLET | Freq: Every day | ORAL | Status: DC

## 2015-12-03 MED ORDER — OXYCODONE HCL 5 MG PO TABS: 5.0000 mg | ORAL_TABLET | ORAL | Status: DC | PRN

## 2015-12-03 NOTE — Progress Notes (Signed)
   Subjective: 1 Day Post-Op Procedure(s) (LRB): RIGHT TOTAL HIP ARTHROPLASTY ANTERIOR APPROACH (Right) Patient reports pain as mild.   Patient seen in rounds with Dr. Lequita HaltAluisio.  Wife in room. Patient is well, and has had no acute complaints or problems We will start therapy today.  If they do well with therapy and meets all goals, then will allow home later this afternoon following therapy. Plan is to go Home after hospital stay.  Objective: Vital signs in last 24 hours: Temp:  [97.5 F (36.4 C)-98.6 F (37 C)] 98.5 F (36.9 C) (05/23 0432) Pulse Rate:  [42-67] 44 (05/23 0432) Resp:  [11-18] 16 (05/23 0432) BP: (100-126)/(58-82) 110/60 mmHg (05/23 0432) SpO2:  [98 %-100 %] 98 % (05/23 0432) Weight:  [82.725 kg (182 lb 6 oz)] 82.725 kg (182 lb 6 oz) (05/22 1121)  Intake/Output from previous day:  Intake/Output Summary (Last 24 hours) at 12/03/15 0803 Last data filed at 12/03/15 0600  Gross per 24 hour  Intake 5193.33 ml  Output   2420 ml  Net 2773.33 ml    Labs:  Recent Labs  12/03/15 0420  HGB 12.6*    Recent Labs  12/03/15 0420  WBC 10.0  RBC 3.82*  HCT 36.3*  PLT 159    Recent Labs  12/03/15 0420  NA 137  K 3.9  CL 104  CO2 26  BUN 8  CREATININE 0.87  GLUCOSE 158*  CALCIUM 8.5*   No results for input(s): LABPT, INR in the last 72 hours.  EXAM General - Patient is Alert, Appropriate and Oriented Extremity - Neurovascular intact Sensation intact distally Dorsiflexion/Plantar flexion intact Dressing - dressing C/D/I Motor Function - intact, moving foot and toes well on exam.  Hemovac pulled without difficulty.  Past Medical History  Diagnosis Date  . Allergy   . Arthritis   . Family history of adverse reaction to anesthesia     pts father had back surgery 10 years ago developed dementia   . Anginal pain (HCC)     during last year has had stress and ECHO test preformed  . Sinus infection     Assessment/Plan: 1 Day Post-Op Procedure(s)  (LRB): RIGHT TOTAL HIP ARTHROPLASTY ANTERIOR APPROACH (Right) Principal Problem:   OA (osteoarthritis) of hip  Estimated body mass index is 25.45 kg/(m^2) as calculated from the following:   Height as of this encounter: 5\' 11"  (1.803 m).   Weight as of this encounter: 82.725 kg (182 lb 6 oz). Advance diet Up with therapy Discharge home with home health if meets all goals with PT  DVT Prophylaxis - Xarelto Weight Bearing As Tolerated right Leg Hemovac Pulled Begin Therapy  If meets goals and able to go home: Up with therapy Discharge home with home health Diet - Regular diet Follow up - in 2 weeks Activity - WBAT Disposition - Home Condition Upon Discharge - pending therapy D/C Meds - See DC Summary DVT Prophylaxis - Xarelto  Avel Peacerew Stefen Juba, PA-C Orthopaedic Surgery 12/03/2015, 8:03 AM

## 2015-12-03 NOTE — Progress Notes (Signed)
Discussed with patient and spouse discharge instructions, both verbalized agreement and understanding.  Patient's IV was d/c with no complications.  Patient to go down in wheelchair with all belongings to go home in private vehicle.

## 2015-12-03 NOTE — Care Management Note (Signed)
Case Management Note  Patient Details  Name: Edwin [name] MRN: [id number] Date of Birth: April 13, 1956  Subjective/Objective:                  RIGHT TOTAL HIP ARTHROPLASTY ANTERIOR APPROACH (Right) Action/Plan: Discharge planning Expected Discharge Date:  12/04/15               Expected Discharge Plan:  [place]  In-House Referral:     Discharge planning Services  CM Consult  Post Acute Care Choice:  Home Health Choice offered to:  Patient  DME Arranged:  N/A DME Agency:  NA  HH Arranged:  PT [place] Agency:  [place]  Status of Service:  Completed, signed off  Medicare Important Message Given:    Date Medicare IM Given:    Medicare IM give by:    Date Additional Medicare IM Given:    Additional Medicare Important Message give by:     If discussed at [place] of Stay Meetings, dates discussed:    Additional Comments: CM met with pt in room to offer choice of home health agency.  Pt chooses Barnab[facility] Lister of [facility] to render HHPT.  Referral called to [facility] rep, Santiago Glad with request for [place].  Pt states he has rolling walke4r and 3n1 at home.  No other CM needs were communicated. Dellie Catholic, RN 12/03/2015, 11:31 AM

## 2015-12-03 NOTE — Evaluation (Signed)
Physical Therapy Evaluation Patient Details Name: Edwin [name] MRN: [id number] DOB: 09-Jan-1956 Today's Date: 12/03/2015   History of Present Illness  Pt is a 60 year old male s/p R direct anterior THA  Clinical Impression  Pt is s/p R THA resulting in the deficits listed below (see PT Problem List).  Pt will benefit from skilled PT to increase their independence and safety with mobility to allow discharge to the venue listed below.  Pt mobilizing well POD #1 and performed LE exercises.  Pt plans to d/c home this afternoon after second session which he will need to practice steps.     Follow Up Recommendations Home health PT    Equipment Recommendations  Rolling walker with 5" wheels    Recommendations for Other Services       Precautions / Restrictions Precautions Precautions: None Restrictions Other Position/Activity Restrictions: WBAT      Mobility  Bed Mobility Overal bed mobility: Needs Assistance Bed Mobility: Supine to Sit     Supine to sit: Supervision     General bed mobility comments: verbal cues for technique  Transfers Overall transfer level: Needs assistance Equipment used: Rolling walker (2 wheeled) Transfers: Sit to/from Stand Sit to Stand: Min guard         General transfer comment: verbal cues for UE and LE positioning  Ambulation/Gait Ambulation/Gait assistance: Min guard Ambulation Distance (Feet): 120 Feet Assistive device: Rolling walker (2 wheeled) Gait Pattern/deviations: Step-to pattern;Step-through pattern;Antalgic     General Gait Details: verbal cues for sequence, RW positioning, posture, able to progress to step through pattern  Stairs            Wheelchair Mobility    Modified Rankin (Stroke Patients Only)       Balance                                             Pertinent Vitals/Pain Pain Assessment: 0-10 Pain Score: 3  Pain Location: R hip Pain Descriptors / Indicators:  Aching;Sore Pain Intervention(s): Limited activity within patient's tolerance;Monitored during session;Repositioned;Ice applied;Premedicated before session    Home Living Family/patient expects to be discharged to:: Private residence Living Arrangements: Spouse/significant other   Type of Home: House Home Access: Stairs to enter   Secretary/administratorntrance Stairs-Number of Steps: 3 Home Layout: Able to live on main level with bedroom/bathroom Home Equipment: None      Prior Function Level of Independence: Independent               Hand Dominance        Extremity/Trunk Assessment               Lower Extremity Assessment: RLE deficits/detail RLE Deficits / Details: decreased functional hip strength observed       Communication   Communication: No difficulties  Cognition Arousal/Alertness: Awake/alert Behavior During Therapy: WFL for tasks assessed/performed Overall Cognitive Status: Within Functional Limits for tasks assessed                      General Comments      Exercises Total Joint Exercises Ankle Circles/Pumps: AROM;Both;5 reps Heel Slides: AROM;Right;10 reps Hip ABduction/ADduction: AROM;Right;10 reps Long Arc Quad: AROM;Right;10 reps Marching in Standing: AROM;Seated;Right;10 reps      Assessment/Plan    PT Assessment Patient needs continued PT services  PT Diagnosis Difficulty walking   PT Problem  List Decreased strength;Decreased mobility;Pain;Decreased knowledge of use of DME  PT Treatment Interventions Functional mobility training;Gait training;Stair training;DME instruction;Patient/family education;Therapeutic activities;Therapeutic exercise   PT Goals (Current goals can be found in the Care Plan section) Acute Rehab PT Goals PT Goal Formulation: With patient Time For Goal Achievement: 12/05/15 Potential to Achieve Goals: Good    Frequency 7X/week   Barriers to discharge        Co-evaluation               End of Session  Equipment Utilized During Treatment: Gait belt Activity Tolerance: Patient tolerated treatment well Patient left: in chair;with family/visitor present;with call bell/phone within reach           Time: 0934-1003 PT Time Calculation (min) (ACUTE ONLY): 29 min   Charges:   PT Evaluation $PT Eval Low Complexity: 1 Procedure PT Treatments $Therapeutic Exercise: 8-22 mins   PT G Codes:        Esau Fridman,KATHrine E 12/03/2015, 10:42 AM Zenovia Jarred, PT, DPT 12/03/2015 Pager: [phone]

## 2015-12-03 NOTE — Discharge Summary (Signed)
Physician Discharge Summary   Patient ID: Edwin [name] MRN: [id number] DOB/AGE: Mar 30, 1956 60 y.o.  Admit date: 12/02/2015 Discharge date: 12/03/2015  Primary Diagnosis:  Osteoarthritis of the Right hip.  Admission Diagnoses:  Past Medical History  Diagnosis Date  . Allergy   . Arthritis   . Family history of adverse reaction to anesthesia     pts father had back surgery 10 years ago developed dementia   . Anginal pain ([place])     during last year has had stress and ECHO test preformed  . Sinus infection    Discharge Diagnoses:   Principal Problem:   OA (osteoarthritis) of hip  Estimated body mass index is 25.45 kg/(m^2) as calculated from the following:   Height as of this encounter: _0  (1.803 m).   Weight as of this encounter: 82.725 kg (182 lb 6 oz).  Procedure(s) (LRB): RIGHT TOTAL HIP ARTHROPLASTY ANTERIOR APPROACH (Right)   Consults: None  HPI: Edwin [name] is a 60 y.o. male who has advanced end-  stage arthritis of his Right hip with progressively worsening pain and  dysfunction.The patient has failed nonoperative management and presents for  total hip arthroplasty.  Laboratory Data: Admission on 12/02/2015  Component Date Value Ref Range Status  . WBC 12/03/2015 10.0  4.0 - 10.5 K/uL Final  . RBC 12/03/2015 3.82* 4.22 - 5.81 MIL/uL Final  . Hemoglobin 12/03/2015 12.6* 13.0 - 17.0 g/dL Final  . HCT 12/03/2015 36.3* 39.0 - 52.0 % Final  . MCV 12/03/2015 95.0  78.0 - 100.0 fL Final  . MCH 12/03/2015 33.0  26.0 - 34.0 pg Final  . MCHC 12/03/2015 34.7  30.0 - 36.0 g/dL Final  . RDW 12/03/2015 12.9  11.5 - 15.5 % Final  . Platelets 12/03/2015 159  150 - 400 K/uL Final  . Sodium 12/03/2015 137  135 - 145 mmol/L Final  . Potassium 12/03/2015 3.9  3.5 - 5.1 mmol/L Final  . Chloride 12/03/2015 104  101 - 111 mmol/L Final  . CO2 12/03/2015 26  22 - 32 mmol/L Final  . Glucose, Bld 12/03/2015 158* 65 - 99 mg/dL Final  . BUN 12/03/2015 8  6 - 20 mg/dL  Final  . Creatinine, Ser 12/03/2015 0.87  0.61 - 1.24 mg/dL Final  . Calcium 12/03/2015 8.5* 8.9 - 10.3 mg/dL Final  . GFR calc non Af Amer 12/03/2015 >60  >60 mL/min Final  . GFR calc Af Amer 12/03/2015 >60  >60 mL/min Final   Comment: (NOTE) The eGFR has been calculated using the CKD EPI equation. This calculation has not been validated in all clinical situations. eGFR's persistently <60 mL/min signify possible Chronic Kidney Disease.   Georgiann Hahn gap 12/03/2015 7  5 - 15 Final  Hospital Outpatient Visit on 11/20/2015  Component Date Value Ref Range Status  . aPTT 11/20/2015 30  24 - 37 seconds Final  . WBC 11/20/2015 9.2  4.0 - 10.5 K/uL Final  . RBC 11/20/2015 4.61  4.22 - 5.81 MIL/uL Final  . Hemoglobin 11/20/2015 15.2  13.0 - 17.0 g/dL Final  . HCT 11/20/2015 44.0  39.0 - 52.0 % Final  . MCV 11/20/2015 95.4  78.0 - 100.0 fL Final  . MCH 11/20/2015 33.0  26.0 - 34.0 pg Final  . MCHC 11/20/2015 34.5  30.0 - 36.0 g/dL Final  . RDW 11/20/2015 13.1  11.5 - 15.5 % Final  . Platelets 11/20/2015 182  150 - 400 K/uL Final  . Sodium 11/20/2015 143  135 - 145 mmol/L Final  . Potassium 11/20/2015 4.3  3.5 - 5.1 mmol/L Final  . Chloride 11/20/2015 106  101 - 111 mmol/L Final  . CO2 11/20/2015 28  22 - 32 mmol/L Final  . Glucose, Bld 11/20/2015 95  65 - 99 mg/dL Final  . BUN 11/20/2015 11  6 - 20 mg/dL Final  . Creatinine, Ser 11/20/2015 0.94  0.61 - 1.24 mg/dL Final  . Calcium 11/20/2015 9.3  8.9 - 10.3 mg/dL Final  . Total Protein 11/20/2015 6.8  6.5 - 8.1 g/dL Final  . Albumin 11/20/2015 4.2  3.5 - 5.0 g/dL Final  . AST 11/20/2015 24  15 - 41 U/L Final  . ALT 11/20/2015 23  17 - 63 U/L Final  . Alkaline Phosphatase 11/20/2015 75  38 - 126 U/L Final  . Total Bilirubin 11/20/2015 1.1  0.3 - 1.2 mg/dL Final  . GFR calc non Af Amer 11/20/2015 >60  >60 mL/min Final  . GFR calc Af Amer 11/20/2015 >60  >60 mL/min Final   Comment: (NOTE) The eGFR has been calculated using the CKD EPI  equation. This calculation has not been validated in all clinical situations. eGFR's persistently <60 mL/min signify possible Chronic Kidney Disease.   . Anion gap 11/20/2015 9  5 - 15 Final  . Prothrombin Time 11/20/2015 13.2  11.6 - 15.2 seconds Final  . INR 11/20/2015 0.98  0.00 - 1.49 Final  . ABO/RH(D) 11/20/2015 O POS   Final  . Antibody Screen 11/20/2015 NEG   Final  . Sample Expiration 11/20/2015 12/04/2015   Final  . Extend sample reason 11/20/2015 NO TRANSFUSIONS OR PREGNANCY IN THE PAST 3 MONTHS   Final  . Color, Urine 11/20/2015 YELLOW  YELLOW Final  . APPearance 11/20/2015 CLEAR  CLEAR Final  . Specific Gravity, Urine 11/20/2015 1.016  1.005 - 1.030 Final  . pH 11/20/2015 6.5  5.0 - 8.0 Final  . Glucose, UA 11/20/2015 NEGATIVE  NEGATIVE mg/dL Final  . Hgb urine dipstick 11/20/2015 NEGATIVE  NEGATIVE Final  . Bilirubin Urine 11/20/2015 NEGATIVE  NEGATIVE Final  . Ketones, ur 11/20/2015 NEGATIVE  NEGATIVE mg/dL Final  . Protein, ur 11/20/2015 NEGATIVE  NEGATIVE mg/dL Final  . Nitrite 11/20/2015 NEGATIVE  NEGATIVE Final  . Leukocytes, UA 11/20/2015 NEGATIVE  NEGATIVE Final   MICROSCOPIC NOT DONE ON URINES WITH NEGATIVE PROTEIN, BLOOD, LEUKOCYTES, NITRITE, OR GLUCOSE <1000 mg/dL.  Marland Kitchen MRSA, PCR 11/20/2015 NEGATIVE  NEGATIVE Final  . Staphylococcus aureus 11/20/2015 NEGATIVE  NEGATIVE Final   Comment:        The Xpert SA Assay (FDA approved for NASAL specimens in patients over 70 years of age), is one component of a comprehensive surveillance program.  Test performance has been validated by [facility] for patients greater than or equal to 80 year old. It is not intended to diagnose infection nor to guide or monitor treatment.   . ABO/RH(D) 11/20/2015 O POS   Final     X-Rays:Dg Pelvis Portable  12/02/2015  CLINICAL DATA:  Right total hip replacement. EXAM: PORTABLE PELVIS 1-2 VIEWS COMPARISON:  None. FINDINGS: Right total hip arthroplasty with surgical drain in  place. Subcutaneous and joint air noted. Osseous structures are otherwise unremarkable. IMPRESSION: Right total hip arthroplasty with expected postoperative findings. Electronically Signed   By: Lorin Picket M.D.   On: 12/02/2015 16:39   Dg C-arm 1-60 Min-no Report  12/02/2015  CLINICAL DATA: hip C-ARM 1-60 MINUTES Fluoroscopy was utilized by the requesting physician.  No radiographic interpretation.    EKG: Orders placed or performed in visit on 11/20/15  . EKG 12-Lead     Hospital Course: Patient was admitted to [facility] and taken to the OR and underwent the above state procedure without complications.  Patient tolerated the procedure well and was later transferred to the recovery room and then to the orthopaedic floor for postoperative care.  They were given PO and IV analgesics for pain control following their surgery.  They were given 24 hours of postoperative antibiotics of  Anti-infectives    Start     Dose/Rate Route Frequency Ordered Stop   12/02/15 2100  ceFAZolin (ANCEF) IVPB 2g/100 mL premix     2 g 200 mL/hr over 30 Minutes Intravenous Every 6 hours 12/02/15 1717 12/03/15 0327   12/02/15 1100  ceFAZolin (ANCEF) IVPB 2g/100 mL premix     2 g 200 mL/hr over 30 Minutes Intravenous On call to O.R. 12/02/15 1100 12/02/15 1459     and started on DVT prophylaxis in the form of Xarelto.   PT and OT were ordered for total hip protocol.  The patient was allowed to be WBAT with therapy. Discharge planning was consulted to help with postop disposition and equipment needs.  Patient had a good night on the evening of surgery.  They started to get up OOB with therapy on day one.  Hemovac drain was pulled without difficulty.   Patient was seen in rounds on day one and doing well.  Worked with therpay and and was ready to go home later that same day.  Discharge home with home health Diet - Regular diet Follow up - in 2 weeks Activity - WBAT Disposition - Home Condition Upon  Discharge - pending therapy D/C Meds - See DC Summary DVT Prophylaxis - Xarelto  Discharge Instructions    Call MD / Call 911    Complete by:  As directed   If you experience chest pain or shortness of breath, CALL 911 and be transported to the hospital emergency room.  If you develope a fever above 101 F, pus (white drainage) or increased drainage or redness at the wound, or calf pain, call your surgeon's office.     Change dressing    Complete by:  As directed   You may change your dressing dressing daily with sterile 4 x 4 inch gauze dressing and paper tape.  Do not submerge the incision under water.     Constipation Prevention    Complete by:  As directed   Drink plenty of fluids.  Prune juice may be helpful.  You may use a stool softener, such as Colace (over the counter) 100 mg twice a day.  Use MiraLax (over the counter) for constipation as needed.     Diet general    Complete by:  As directed      Discharge instructions    Complete by:  As directed   Pick up stool softner and laxative for home use following surgery while on pain medications. Do not submerge incision under water. May remove the surgical dressing tomorrow, Wednesday 12/04/2015, and then apply a dry gauze dressing daily. Please use good hand washing techniques while changing dressing each day. May shower starting three days after surgery starting Thursday 12/05/2015. Please use a clean towel to pat the incision dry following showers. Continue to use ice for pain and swelling after surgery. Do not use any lotions or creams on the incision until instructed by your  Psychologist, sport and exercise.  Postoperative Constipation Protocol  Constipation - defined medically as fewer than three stools per week and severe constipation as less than one stool per week.  One of the most common issues patients have following surgery is constipation. Even if you have a regular bowel pattern at home, your normal regimen is likely to be disrupted due to  multiple reasons following surgery. Combination of anesthesia, postoperative narcotics, change in appetite and fluid intake all can affect your bowels. In order to avoid complications following surgery, here are some recommendations in order to help you during your recovery period.  Colace (docusate) - Pick up an over-the-counter form of Colace or another stool softener and take twice a day as long as you are requiring postoperative pain medications. Take with a full glass of water daily. If you experience loose stools or diarrhea, hold the colace until you stool forms back up. If your symptoms do not get better within 1 week or if they get worse, check with your doctor.  Dulcolax (bisacodyl) - Pick up over-the-counter and take as directed by the product packaging as needed to assist with the movement of your bowels. Take with a full glass of water. Use this product as needed if not relieved by Colace only.   MiraLax (polyethylene glycol) - Pick up over-the-counter to have on hand. MiraLax is a solution that will increase the amount of water in your bowels to assist with bowel movements. Take as directed and can mix with a glass of water, juice, soda, coffee, or tea. Take if you go more than two days without a movement. Do not use MiraLax more than once per day. Call your doctor if you are still constipated or irregular after using this medication for 7 days in a row.  If you continue to have problems with postoperative constipation, please contact the office for further assistance and recommendations. If you experience "the worst abdominal pain ever" or develop nausea or vomiting, please contact the office immediatly for further recommendations for treatment.  Take Xarelto for two and a half more weeks, then discontinue Xarelto. Once the patient has completed the blood thinner regimen, then take a Baby 81 mg Aspirin daily for three more weeks.     Do not sit on low chairs, stoools or toilet  seats, as it may be difficult to get up from low surfaces    Complete by:  As directed      Driving restrictions    Complete by:  As directed   No driving until released by the physician.     Increase activity slowly as tolerated    Complete by:  As directed      Lifting restrictions    Complete by:  As directed   No lifting until released by the physician.     Patient may shower    Complete by:  As directed   You may shower without a dressing once there is no drainage.  Do not wash over the wound.  If drainage remains, do not shower until drainage stops.     TED hose    Complete by:  As directed   Use stockings (TED hose) for 3 weeks on both leg(s).  You may remove them at night for sleeping.     Weight bearing as tolerated    Complete by:  As directed   Laterality:  right  Extremity:  Lower            Medication List  STOP taking these medications        HYDROcodone-homatropine 5-1.5 MG/5ML syrup  Commonly known as:  HYCODAN     multivitamin with minerals tablet     VIAGRA 100 MG tablet  Generic drug:  sildenafil      TAKE these medications        azelastine 0.1 % nasal spray  Commonly known as:  ASTELIN  Place 1 spray into both nostrils at bedtime.     fluticasone 50 MCG/ACT nasal spray  Commonly known as:  FLONASE  Place 1 spray into both nostrils at bedtime.     methocarbamol 500 MG tablet  Commonly known as:  ROBAXIN  Take 1 tablet (500 mg total) by mouth every 6 (six) hours as needed for muscle spasms.     oxyCODONE 5 MG immediate release tablet  Commonly known as:  Oxy IR/ROXICODONE  Take 1-2 tablets (5-10 mg total) by mouth every 3 (three) hours as needed for moderate pain or severe pain.     rivaroxaban 10 MG Tabs tablet  Commonly known as:  XARELTO  Take 1 tablet (10 mg total) by mouth daily with breakfast. Take Xarelto for two and a half more weeks, then discontinue Xarelto. Once the patient has completed the blood thinner regimen, then take a  Baby 81 mg Aspirin daily for three more weeks.     traMADol 50 MG tablet  Commonly known as:  ULTRAM  Take 1-2 tablets (50-100 mg total) by mouth every 6 (six) hours as needed (mild pain).           Follow-up Information    Follow up with Gearlean Alf, MD. Schedule an appointment as soon as possible for a visit on 12/17/2015.   Specialty:  Orthopedic Surgery   Why:  Call office at [phone] to setup appointment on Tuesday 12/17/2015 with Dr. Wynelle Link.   Contact information:   [address] [place] [address] [phone]       Signed: Arlee Muslim, PA-C Orthopaedic Surgery 12/03/2015, 8:14 AM

## 2015-12-03 NOTE — Progress Notes (Signed)
Utilization review completed.  

## 2015-12-03 NOTE — Progress Notes (Signed)
Physical Therapy Treatment Note    12/03/15 1500  PT Visit Information  Last PT Received On 12/03/15  Assistance Needed +1  History of Present Illness Pt is a 60 year old male s/p R direct anterior THA  Subjective Data  Subjective Pt ambulated good distance in hallway and practiced safe stair technique with family present and observing.  Answered pt and family's questions.  Pt feels ready to d/c home today.  Precautions  Precautions None  Restrictions  Other Position/Activity Restrictions WBAT  Pain Assessment  Pain Assessment 0-10  Pain Score 2  Pain Location R hip  Pain Descriptors / Indicators Aching;Sore  Pain Intervention(s) Limited activity within patient's tolerance;Monitored during session;Repositioned  Cognition  Arousal/Alertness Awake/alert  Behavior During Therapy WFL for tasks assessed/performed  Overall Cognitive Status Within Functional Limits for tasks assessed  Transfers  Overall transfer level Needs assistance  Equipment used Rolling walker (2 wheeled)  Transfers Sit to/from Stand  Sit to Stand Supervision  Ambulation/Gait  Ambulation/Gait assistance Min guard;Supervision  Ambulation Distance (Feet) 320 Feet  Assistive device Rolling walker (2 wheeled)  Gait Pattern/deviations Step-through pattern;Antalgic;Decreased stance time - right  General Gait Details mobilizing well with RW, reports pain controlled  Stairs Yes  Stairs assistance Min guard  Stair Management One rail Left;Forwards;Step to pattern  Number of Stairs 2  General stair comments verbal cues for sequence and safety, performed once with 2 rails and then again with only one rail, pt and family educated on safety and sequence.  PT - End of Session  Activity Tolerance Patient tolerated treatment well  Patient left in chair;with family/visitor present;with call bell/phone within reach  PT - Assessment/Plan  PT Plan Current plan remains appropriate  PT Frequency (ACUTE ONLY) 7X/week  Follow Up  Recommendations Home health PT  PT equipment Rolling walker with 5" wheels  PT Goal Progression  Progress towards PT goals Progressing toward goals  PT Time Calculation  PT Start Time (ACUTE ONLY) 1302  PT Stop Time (ACUTE ONLY) 1319  PT Time Calculation (min) (ACUTE ONLY) 17 min  PT General Charges  $$ ACUTE PT VISIT 1 Procedure  PT Treatments  $Gait Training 8-22 mins   Zenovia JarredKati Sherrita Riederer, PT, DPT 12/03/2015 Pager: [phone]

## 2015-12-03 NOTE — Evaluation (Signed)
Occupational Therapy Evaluation Patient Details Name: Edwin [name] MRN: [id number] DOB: 05-07-1956 Today's Date: 12/03/2015    History of Present Illness Pt is a 60 year old male s/p R direct anterior THA   Clinical Impression   Patient evaluated by Occupational Therapy with no further acute OT needs identified. All education has been completed and the patient has no further questions. Pt is able to perform ADLs with supervision.  All education completed, and questions answered. See below for any follow-up Occupational Therapy or equipment needs. OT is signing off. Thank you for this referral.      Follow Up Recommendations  No OT follow up;Supervision/Assistance - 24 hour    Equipment Recommendations  None recommended by OT    Recommendations for Other Services       Precautions / Restrictions Precautions Precautions: None Restrictions Weight Bearing Restrictions: No Other Position/Activity Restrictions: WBAT      Mobility Bed Mobility                  Transfers Overall transfer level: Needs assistance Equipment used: Rolling walker (2 wheeled) Transfers: Sit to/from Stand;Stand Pivot Transfers Sit to Stand: Supervision Stand pivot transfers: Supervision            Balance                                            ADL Overall ADL's : Needs assistance/impaired Eating/Feeding: Independent   Grooming: Wash/dry hands;Wash/dry face;Oral care;Brushing hair;Supervision/safety;Standing   Upper Body Bathing: Set up;Sitting   Lower Body Bathing: Supervison/ safety;Sit to/from stand   Upper Body Dressing : Set up;Sitting   Lower Body Dressing: Supervision/safety;Sit to/from stand   Toilet Transfer: Min guard;Ambulation;Comfort height toilet;Regular Toilet;RW Toilet Transfer Details (indicate cue type and reason): Discussed options for toilet transfer.  Pt appears to be able to safely perfrom toilet transfer without use of 3in1,  and demonstrates good safety awareness.  Wife very anxious and encouraging pt to use 3in1 Toileting- Architect and Hygiene: Supervision/safety;Sit to/from stand   Tub/ Shower Transfer: Walk-in shower;Supervision/safety;Ambulation;3 in 1;Rolling walker Tub/Shower Transfer Details (indicate cue type and reason): Pt instructed in use of 3in1 for shower seat and how to safely transfer into shower  Functional mobility during ADLs: Supervision/safety;Rolling walker General ADL Comments: discussed removal of throw rugs.  Pt and wife with multiple questions.  Referred them to discharge instructions and answered questions.  Pt able to access Rt foot with minimal difficulty and increased pain      Vision     Perception     Praxis      Pertinent Vitals/Pain Pain Assessment: 0-10 Pain Score: 2  Pain Location: Rt hip  Pain Descriptors / Indicators: Aching Pain Intervention(s): Monitored during session;Patient requesting pain meds-RN notified     Hand Dominance     Extremity/Trunk Assessment Upper Extremity Assessment Upper Extremity Assessment: Overall WFL for tasks assessed   Lower Extremity Assessment Lower Extremity Assessment: Defer to PT evaluation   Cervical / Trunk Assessment Cervical / Trunk Assessment: Normal   Communication Communication Communication: No difficulties   Cognition Arousal/Alertness: Awake/alert Behavior During Therapy: WFL for tasks assessed/performed Overall Cognitive Status: Within Functional Limits for tasks assessed                     General Comments       Exercises  Shoulder Instructions      Home Living Family/patient expects to be discharged to:: Private residence Living Arrangements: Spouse/significant other Available Help at Discharge: Family;Available 24 hours/day Type of Home: House Home Access: Stairs to enter Entergy CorporationEntrance Stairs-Number of Steps: 3   Home Layout: Able to live on main level with  bedroom/bathroom     Bathroom Shower/Tub: Producer, television/film/videoWalk-in shower   Bathroom Toilet: Standard     Home Equipment: Environmental consultantWalker - 2 wheels;Bedside commode          Prior Functioning/Environment Level of Independence: Independent             OT Diagnosis: Generalized weakness;Acute pain   OT Problem List:     OT Treatment/Interventions:      OT Goals(Current goals can be found in the care plan section) Acute Rehab OT Goals Patient Stated Goal: to go home  OT Goal Formulation: All assessment and education complete, DC therapy  OT Frequency:     Barriers to D/C:            Co-evaluation              End of Session Equipment Utilized During Treatment: Rolling walker Nurse Communication: Mobility status;Patient requests pain meds  Activity Tolerance: Patient tolerated treatment well Patient left: in chair;with call bell/phone within reach;with family/visitor present   Time: [id number] OT Time Calculation (min): 61 min Charges:  OT General Charges $OT Visit: 1 Procedure OT Evaluation $OT Eval Low Complexity: 1 Procedure OT Treatments $Self Care/Home Management : 38-52 mins G-Codes:    Edwin [name] M 12/03/2015, 2:50 PM

## 2015-12-03 NOTE — Discharge Instructions (Addendum)
° °Dr. Frank Aluisio °Total Joint Specialist °[facility] °[address] °[place], [place] [address] °[phone] ° °ANTERIOR APPROACH TOTAL HIP REPLACEMENT POSTOPERATIVE DIRECTIONS ° ° °Hip Rehabilitation, Guidelines Following Surgery  °The results of a hip operation are greatly improved after range of motion and muscle strengthening exercises. Follow all safety measures which are given to protect your hip. If any of these exercises cause increased pain or swelling in your joint, decrease the amount until you are comfortable again. Then slowly increase the exercises. Call your caregiver if you have problems or questions.  ° °HOME CARE INSTRUCTIONS  °Remove items at home which could result in a fall. This includes throw rugs or furniture in walking pathways.  °· ICE to the affected hip every three hours for 30 minutes at a time and then as needed for pain and swelling.  Continue to use ice on the hip for pain and swelling from surgery. You may notice swelling that will progress down to the foot and ankle.  This is normal after surgery.  Elevate the leg when you are not up walking on it.   °· Continue to use the breathing machine which will help keep your temperature down.  It is common for your temperature to cycle up and down following surgery, especially at night when you are not up moving around and exerting yourself.  The breathing machine keeps your lungs expanded and your temperature down. ° ° °DIET °You may resume your previous home diet once your are discharged from the hospital. ° °DRESSING / WOUND CARE / SHOWERING °You may shower 3 days after surgery, but keep the wounds dry during showering.  You may use an occlusive plastic wrap (Press'n Seal for example), NO SOAKING/SUBMERGING IN THE BATHTUB.  If the bandage gets wet, change with a clean dry gauze.  If the incision gets wet, pat the wound dry with a clean towel. °You may start showering once you are discharged home but do not  submerge the incision under water. Just pat the incision dry and apply a dry gauze dressing on daily. °Change the surgical dressing daily and reapply a dry dressing each time. ° °ACTIVITY °Walk with your walker as instructed. °Use walker as long as suggested by your caregivers. °Avoid periods of inactivity such as sitting longer than an hour when not asleep. This helps prevent blood clots.  °You may resume a sexual relationship in one month or when given the OK by your doctor.  °You may return to work once you are cleared by your doctor.  °Do not drive a car for 6 weeks or until released by you surgeon.  °Do not drive while taking narcotics. ° °WEIGHT BEARING °Weight bearing as tolerated with assist device (walker, cane, etc) as directed, use it as long as suggested by your surgeon or therapist, typically at least 4-6 weeks. ° °POSTOPERATIVE CONSTIPATION PROTOCOL °Constipation - defined medically as fewer than three stools per week and severe constipation as less than one stool per week. ° °One of the most common issues patients have following surgery is constipation.  Even if you have a regular bowel pattern at home, your normal regimen is likely to be disrupted due to multiple reasons following surgery.  Combination of anesthesia, postoperative narcotics, change in appetite and fluid intake all can affect your bowels.  In order to avoid complications following surgery, here are some recommendations in order to help you during your recovery period. ° °Colace (docusate) - Pick up an over-the-counter   form of Colace or another stool softener and take twice a day as long as you are requiring postoperative pain medications.  Take with a full glass of water daily.  If you experience loose stools or diarrhea, hold the colace until you stool forms back up.  If your symptoms do not get better within 1 week or if they get worse, check with your doctor. ° °Dulcolax (bisacodyl) - Pick up over-the-counter and take as directed  by the product packaging as needed to assist with the movement of your bowels.  Take with a full glass of water.  Use this product as needed if not relieved by Colace only.  ° °MiraLax (polyethylene glycol) - Pick up over-the-counter to have on hand.  MiraLax is a solution that will increase the amount of water in your bowels to assist with bowel movements.  Take as directed and can mix with a glass of water, juice, soda, coffee, or tea.  Take if you go more than two days without a movement. °Do not use MiraLax more than once per day. Call your doctor if you are still constipated or irregular after using this medication for 7 days in a row. ° °If you continue to have problems with postoperative constipation, please contact the office for further assistance and recommendations.  If you experience "the worst abdominal pain ever" or develop nausea or vomiting, please contact the office immediatly for further recommendations for treatment. ° °ITCHING ° If you experience itching with your medications, try taking only a single pain pill, or even half a pain pill at a time.  You can also use Benadryl over the counter for itching or also to help with sleep.  ° °TED HOSE STOCKINGS °Wear the elastic stockings on both legs for three weeks following surgery during the day but you may remove then at night for sleeping. ° °MEDICATIONS °See your medication summary on the “After Visit Summary” that the nursing staff will review with you prior to discharge.  You may have some home medications which will be placed on hold until you complete the course of blood thinner medication.  It is important for you to complete the blood thinner medication as prescribed by your surgeon.  Continue your approved medications as instructed at time of discharge. ° °PRECAUTIONS °If you experience chest pain or shortness of breath - call 911 immediately for transfer to the hospital emergency department.  °If you develop a fever greater that 101 F,  purulent drainage from wound, increased redness or drainage from wound, foul odor from the wound/dressing, or calf pain - CONTACT YOUR SURGEON.   °                                                °FOLLOW-UP APPOINTMENTS °Make sure you keep all of your appointments after your operation with your surgeon and caregivers. You should call the office at the above phone number and make an appointment for approximately two weeks after the date of your surgery or on the date instructed by your surgeon outlined in the "After Visit Summary". ° °RANGE OF MOTION AND STRENGTHENING EXERCISES  °These exercises are designed to help you keep full movement of your hip joint. Follow your caregiver's or physical therapist's instructions. Perform all exercises about fifteen times, three times per day or as directed. Exercise both hips, even if you   have had only one joint replacement. These exercises can be done on a training (exercise) mat, on the floor, on a table or on a bed. Use whatever works the best and is most comfortable for you. Use music or television while you are exercising so that the exercises are a pleasant break in your day. This will make your life better with the exercises acting as a break in routine you can look forward to.  °Lying on your back, slowly slide your foot toward your buttocks, raising your knee up off the floor. Then slowly slide your foot back down until your leg is straight again.  °Lying on your back spread your legs as far apart as you can without causing discomfort.  °Lying on your side, raise your upper leg and foot straight up from the floor as far as is comfortable. Slowly lower the leg and repeat.  °Lying on your back, tighten up the muscle in the front of your thigh (quadriceps muscles). You can do this by keeping your leg straight and trying to raise your heel off the floor. This helps strengthen the largest muscle supporting your knee.  °Lying on your back, tighten up the muscles of your  buttocks both with the legs straight and with the knee bent at a comfortable angle while keeping your heel on the floor.  ° °IF YOU ARE TRANSFERRED TO A SKILLED REHAB FACILITY °If the patient is transferred to a skilled rehab facility following release from the hospital, a list of the current medications will be sent to the facility for the patient to continue.  When discharged from the skilled rehab facility, please have the facility set up the patient's Home Health Physical Therapy prior to being released. Also, the skilled facility will be responsible for providing the patient with their medications at time of release from the facility to include their pain medication, the muscle relaxants, and their blood thinner medication. If the patient is still at the rehab facility at time of the two week follow up appointment, the skilled rehab facility will also need to assist the patient in arranging follow up appointment in our office and any transportation needs. ° °MAKE SURE YOU:  °Understand these instructions.  °Get help right away if you are not doing well or get worse.  ° ° °Pick up stool softner and laxative for home use following surgery while on pain medications. °Do not submerge incision under water. °Please use good hand washing techniques while changing dressing each day. °May shower starting three days after surgery. °Please use a clean towel to pat the incision dry following showers. °Continue to use ice for pain and swelling after surgery. °Do not use any lotions or creams on the incision until instructed by your surgeon. ° °Take Xarelto for two and a half more weeks, then discontinue Xarelto. °Once the patient has completed the blood thinner regimen, then take a Baby 81 mg Aspirin daily for three more weeks. ° ° °Information on my medicine - XARELTO® (Rivaroxaban) ° °This medication education was reviewed with me or my healthcare representative as part of my discharge preparation.  The pharmacist that  spoke with me during my hospital stay was:  Runyon, Amanda, RPH ° °Why was Xarelto® prescribed for you? °Xarelto® was prescribed for you to reduce the risk of blood clots forming after orthopedic surgery. The medical term for these abnormal blood clots is venous thromboembolism (VTE). ° °What do you need to know about xarelto® ? °Take your Xarelto® ONCE   DAILY at the same time every day. °You may take it either with or without food. ° °If you have difficulty swallowing the tablet whole, you may crush it and mix in applesauce just prior to taking your dose. ° °Take Xarelto® exactly as prescribed by your doctor and DO NOT stop taking Xarelto® without talking to the doctor who prescribed the medication.  Stopping without other VTE prevention medication to take the place of Xarelto® may increase your risk of developing a clot. ° °After discharge, you should have regular check-up appointments with your healthcare provider that is prescribing your Xarelto®.   ° °What do you do if you miss a dose? °If you miss a dose, take it as soon as you remember on the same day then continue your regularly scheduled once daily regimen the next day. Do not take two doses of Xarelto® on the same day.  ° °Important Safety Information °A possible side effect of Xarelto® is bleeding. You should call your healthcare provider right away if you experience any of the following: °? Bleeding from an injury or your nose that does not stop. °? Unusual colored urine (red or dark brown) or unusual colored stools (red or black). °? Unusual bruising for unknown reasons. °? A serious fall or if you hit your head (even if there is no bleeding). ° °Some medicines may interact with Xarelto® and might increase your risk of bleeding while on Xarelto®. To help avoid this, consult your healthcare provider or pharmacist prior to using any new prescription or non-prescription medications, including herbals, vitamins, non-steroidal anti-inflammatory drugs (NSAIDs)  and supplements. ° °This website has more information on Xarelto®: www.xarelto.com. ° ° ° °

## 2016-02-04 ENCOUNTER — Ambulatory Visit (INDEPENDENT_AMBULATORY_CARE_PROVIDER_SITE_OTHER): Payer: BC Managed Care – PPO | Admitting: Family Medicine

## 2016-02-04 ENCOUNTER — Encounter: Payer: Self-pay | Admitting: Family Medicine

## 2016-02-04 VITALS — BP 100/80 | HR 80 | Temp 97.8°F | Ht 71.0 in | Wt 182.0 lb

## 2016-02-04 DIAGNOSIS — J019 Acute sinusitis, unspecified: Secondary | ICD-10-CM

## 2016-02-04 MED ORDER — PREDNISONE 10 MG PO TABS: ORAL_TABLET | ORAL | 0 refills | Status: DC

## 2016-02-04 MED ORDER — HYDROCODONE-HOMATROPINE 5-1.5 MG/5ML PO SYRP: 5.0000 mL | ORAL_SOLUTION | Freq: Four times a day (QID) | ORAL | 0 refills | Status: AC | PRN

## 2016-02-04 MED ORDER — CEFUROXIME AXETIL 500 MG PO TABS: 500.0000 mg | ORAL_TABLET | Freq: Two times a day (BID) | ORAL | 0 refills | Status: DC

## 2016-02-04 NOTE — Progress Notes (Signed)
Pre visit review using our clinic review tool, if applicable. No additional management support is needed unless otherwise documented below in the visit note. 

## 2016-02-04 NOTE — Progress Notes (Signed)
Subjective:     Patient ID: Edwin [name], male   DOB: Oct 06, 1955, 60 y.o.   MRN: [id number]  HPI Patient seen with three-week history of sinus congestion and sinus pressure. He had right total hip replacement about 2 months ago and has recovered very well from that. 3 weeks ago he was traveling with his wife and she had viral upper respiratory infection and he thinks he caught the same. He has a long history of chronic and recurrent sinusitis. He has some nasal stuffiness yellow tinged mucus, dry cough, and intermittent laryngitis. No headaches. Cough has been severe at times at night.  Long history of allergies and takes multiple allergy medications and continues to see allergist for immunotherapy. He also does nasal saline irrigation regularly along with Flonase and Astelin.  Past Medical History:  Diagnosis Date  . Allergy   . Anginal pain (HCC)    during last year has had stress and ECHO test preformed  . Arthritis   . Family history of adverse reaction to anesthesia    pts father had back surgery 10 years ago developed dementia   . Sinus infection    Past Surgical History:  Procedure Laterality Date  . KNEE SURGERY  2009   microfracture surgery; left   . ROOT CANAL    . TONSILLECTOMY    . 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 His smoking use included Cigarettes. He has a 5.00 pack-year smoking history. He has never used smokeless tobacco. He reports that he drinks about 2.0 oz of alcohol per week . He reports that he does not use drugs. family history includes CAD in his brother; CAD (age of onset: 57) in his paternal uncle; CAD (age of onset: 53) in his brother; Dementia in his father; Hyperlipidemia in his father; Multiple sclerosis in his brother. No Known  Allergies   Review of Systems  Constitutional: Positive for fatigue. Negative for chills and fever.  HENT: Positive for congestion, sinus pressure and sore throat.   Respiratory: Positive for cough.        Objective:   Physical Exam  Constitutional: He appears well-developed and well-nourished.  HENT:  Right Ear: External ear normal.  Left Ear: External ear normal.  Mouth/Throat: Oropharynx is clear and moist.  Erythematous nasal mucosa otherwise clear  Neck: Neck supple.  Cardiovascular: Normal rate and regular rhythm.   Pulmonary/Chest: Effort normal and breath sounds normal. No respiratory distress. He has no wheezes. He has no rales.  Lymphadenopathy:    He has no cervical adenopathy.       Assessment:      recent viral URI. Now presenting with persistent sinusitis symptoms Plan:     -Stay well-hydrated -Continue usual allergy medications and nasal saline irrigation -Prednisone taper over the next week. He is responded well with this in the past -Hycodan cough syrup 1 teaspoon daily at bedtime for severe cough -Ceftin 500 mrem twice a day for 10 days if not improving over the next few days he will start  Edwin Covey MD [name]

## 2016-04-30 LAB — HM COLONOSCOPY

## 2016-06-17 ENCOUNTER — Ambulatory Visit (INDEPENDENT_AMBULATORY_CARE_PROVIDER_SITE_OTHER)
Admission: RE | Admit: 2016-06-17 | Discharge: 2016-06-17 | Disposition: A | Discharge status: Home / Self Care | Payer: BC Managed Care – PPO | Source: Ambulatory Visit | Attending: Family Medicine | Admitting: Family Medicine

## 2016-06-17 ENCOUNTER — Ambulatory Visit (INDEPENDENT_AMBULATORY_CARE_PROVIDER_SITE_OTHER): Payer: BC Managed Care – PPO | Admitting: Family Medicine

## 2016-06-17 ENCOUNTER — Encounter: Payer: Self-pay | Admitting: Family Medicine

## 2016-06-17 VITALS — BP 114/70 | HR 92 | Temp 98.3°F | Wt 184.5 lb

## 2016-06-17 DIAGNOSIS — R0982 Postnasal drip: Secondary | ICD-10-CM | POA: Diagnosis not present

## 2016-06-17 DIAGNOSIS — R05 Cough: Secondary | ICD-10-CM | POA: Diagnosis not present

## 2016-06-17 DIAGNOSIS — R053 Chronic cough: Secondary | ICD-10-CM

## 2016-06-17 DIAGNOSIS — J309 Allergic rhinitis, unspecified: Secondary | ICD-10-CM

## 2016-06-17 MED ORDER — PREDNISONE 10 MG PO TABS: ORAL_TABLET | ORAL | 0 refills | Status: DC

## 2016-06-17 NOTE — Progress Notes (Signed)
Pre visit review using our clinic review tool, if applicable. No additional management support is needed unless otherwise documented below in the visit note. 

## 2016-06-17 NOTE — Progress Notes (Signed)
Subjective:     Patient ID: Edwin [name], male   DOB: 12-22-1955, 60 y.o.   MRN: [id number]  HPI Patient seen with chronic cough for estimated 2 months duration. Nonsmoker. He does have perennial allergies and still has some frequent postnasal drip symptoms. Seen allergist and is maintained on Singulair, over-the-counter antihistamine, Astelin, Flonase. Denies any colored nasal discharge or recent bloody nasal discharge. His cough is mostly dry. Seems to be worse after eating. No obvious reflux symptoms. No appetite or weight changes. No fevers or chills. No pleuritic pain. He still has frequent nasal congestion even with multiple medications above. Still exercising regularly.  He's had occasional fleeting sharp left chest wall pains which are not related activity. Extensive workup March 2016 with echocardiogram, exercise tolerance test, and coronary calcium morphology and score which did not show any concerns. Cough is relatively mild and no clear exacerbating factors other than possible association with eating. Not aware of any wheezing.  Past Medical History:  Diagnosis Date  . Allergy   . Anginal pain (HCC)    during last year has had stress and ECHO test preformed  . Arthritis   . Family history of adverse reaction to anesthesia    pts father had back surgery 10 years ago developed dementia   . Sinus infection    Past Surgical History:  Procedure Laterality Date  . KNEE SURGERY  2009   microfracture surgery; left   . ROOT CANAL    . TONSILLECTOMY    . 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 His smoking use included Cigarettes. He has a 5.00 pack-year smoking history. He has never used smokeless tobacco. He reports that he drinks about 2.0 oz of alcohol per week . He reports that he  does not use drugs. family history includes CAD in his brother; CAD (age of onset: 1150) in his paternal uncle; CAD (age of onset: 2256) in his brother; Dementia in his father; Hyperlipidemia in his father; Multiple sclerosis in his brother. No Known Allergies   Review of Systems  Constitutional: Negative for chills and fever.  HENT: Positive for congestion and postnasal drip. Negative for facial swelling, sinus pressure and sore throat.   Respiratory: Positive for choking. Negative for shortness of breath and wheezing.   Cardiovascular: Negative for palpitations and leg swelling.  Neurological: Negative for dizziness and headaches.       Objective:   Physical Exam  Constitutional: He appears well-developed and well-nourished.  HENT:  Right Ear: External ear normal.  Left Ear: External ear normal.  Mouth/Throat: Oropharynx is clear and moist. No oropharyngeal exudate.  Neck: Neck supple. No thyromegaly present.  Cardiovascular: Normal rate and regular rhythm.   Pulmonary/Chest: Effort normal and breath sounds normal. No respiratory distress. He has no wheezes. He has no rales.  Musculoskeletal: He exhibits edema.  Lymphadenopathy:    He has no cervical adenopathy.       Assessment:     Chronic cough and a patient with perennial allergies and frequent postnasal drip. May have some silent reflux but no obvious reflux symptoms. He is not any red flags such as appetite or weight change, fever, dyspnea    Plan:     -Obtain chest x-ray given duration of cough -Consider over-the-counter PPI such as Nexium or Prilosec -  Prednisone taper over the next week -Continue his usual allergy medications -Touch base in 2-3 weeks if cough not improving  Kristian CoveyBruce W Leinaala Catanese MD [facility]

## 2016-06-17 NOTE — Patient Instructions (Signed)
Consider OTC Nexium or Prilosec. Touch base in 2-3 weeks if cough no better.

## 2016-08-05 ENCOUNTER — Ambulatory Visit: Payer: BC Managed Care – PPO | Admitting: Family Medicine

## 2016-08-07 ENCOUNTER — Encounter: Payer: Self-pay | Admitting: Family Medicine

## 2016-08-07 ENCOUNTER — Ambulatory Visit (INDEPENDENT_AMBULATORY_CARE_PROVIDER_SITE_OTHER): Payer: BC Managed Care – PPO | Admitting: Family Medicine

## 2016-08-07 VITALS — BP 100/80 | HR 59 | Ht 71.0 in | Wt 187.9 lb

## 2016-08-07 DIAGNOSIS — K219 Gastro-esophageal reflux disease without esophagitis: Secondary | ICD-10-CM

## 2016-08-07 DIAGNOSIS — R0789 Other chest pain: Secondary | ICD-10-CM

## 2016-08-07 DIAGNOSIS — J329 Chronic sinusitis, unspecified: Secondary | ICD-10-CM | POA: Diagnosis not present

## 2016-08-07 MED ORDER — CEFUROXIME AXETIL 500 MG PO TABS: 500.0000 mg | ORAL_TABLET | Freq: Two times a day (BID) | ORAL | 0 refills | Status: DC

## 2016-08-07 MED ORDER — PREDNISONE 10 MG PO TABS: ORAL_TABLET | ORAL | 0 refills | Status: DC

## 2016-08-07 NOTE — Progress Notes (Signed)
Pre visit review using our clinic review tool, if applicable. No additional management support is needed unless otherwise documented below in the visit note. 

## 2016-08-07 NOTE — Patient Instructions (Signed)
We will set up limited CT of sinuses and then decide on ENT referral after that is back.

## 2016-08-07 NOTE — Progress Notes (Signed)
Subjective:     Patient ID: Edwin [name], male   DOB: 05-07-1956, 61 y.o.   MRN: [id number]  HPI Patient presents with several weeks of atypical chest symptoms. He states that 3 out of 10 dull discomfort which is relatively constant and never related exercise. He is able to exercise vigorously without any symptoms. He had extensive cardiac workup 2016 with echocardiogram, exercise tolerance test, and CT morphology study with minimal coronary disease. He denies any pleuritic pain. He suspects he may have some GERD symptoms. He took some Zantac 150 mg daily and symptoms went away and after reducing this to 75 mg symptoms returned. He is currently asymptomatic after going back to 150 mg. No dysphagia. No appetite or weight changes. No pain with swallowing.  Patient has chronic sinusitis symptoms. He feels congested currently and has for weeks. He takes Astelin and Flonase daily. He has some yellow nasal discharge. Intermittent facial pain. Last CT scan was 2008 which showed pansinusitis He has seen ENT in the past they suggested surgery and he declined No recent fever or chills.  Past Medical History:  Diagnosis Date  . Allergy   . Anginal pain (HCC)    during last year has had stress and ECHO test preformed  . Arthritis   . Family history of adverse reaction to anesthesia    pts father had back surgery 10 years ago developed dementia   . Sinus infection    Past Surgical History:  Procedure Laterality Date  . KNEE SURGERY  2009   microfracture surgery; left   . ROOT CANAL    . TONSILLECTOMY    . 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 His smoking use included Cigarettes. He has a 5.00 pack-year smoking history. He has never used smokeless tobacco. He reports that he drinks about 2.0 oz  of alcohol per week . He reports that he does not use drugs. family history includes CAD in his brother; CAD (age of onset: 50) in his paternal uncle; CAD (age of onset: 25) in his brother; Dementia in his father; Hyperlipidemia in his father; Multiple sclerosis in his brother. No Known Allergies   Review of Systems  Constitutional: Negative for appetite change, fatigue and unexpected weight change.  HENT: Positive for congestion, sinus pain and sinus pressure.   Eyes: Negative for visual disturbance.  Respiratory: Negative for cough, chest tightness, shortness of breath and wheezing.   Cardiovascular: Positive for chest pain. Negative for palpitations and leg swelling.  Gastrointestinal: Negative for abdominal pain.  Neurological: Negative for dizziness, syncope, weakness, light-headedness and headaches.       Objective:   Physical Exam  Constitutional: He appears well-developed and well-nourished.  HENT:  Right Ear: External ear normal.  Left Ear: External ear normal.  Mouth/Throat: Oropharynx is clear and moist.  Neck: Neck supple. No thyromegaly present.  Cardiovascular: Normal rate and regular rhythm.   Pulmonary/Chest: Effort normal and breath sounds normal. No respiratory distress. He has no wheezes. He has no rales.  Musculoskeletal: He exhibits no edema.  Lymphadenopathy:    He has no cervical adenopathy.       Assessment:     #1 atypical chest symptoms. Possible GERD related. He's had no exertional symptoms whatsoever and extensive cardiac workup as above  #2 chronic sinusitis symptoms  Plan:     -Set up limited CT of sinuses to further evaluate -Consider ENT referral if he has persistent or chronic sinusitis evidenced by CT scan -Refilled Ceftin and prednisone to use for current flareup -Continue Zantac 150 milligrams twice daily as needed and if symptoms stable on one month try gradually tapering off  Kristian CoveyBruce W Zandon Talton MD [facility]

## 2016-08-11 ENCOUNTER — Other Ambulatory Visit: Payer: BC Managed Care – PPO

## 2016-08-12 ENCOUNTER — Telehealth: Payer: Self-pay | Admitting: Family Medicine

## 2016-08-12 ENCOUNTER — Ambulatory Visit (INDEPENDENT_AMBULATORY_CARE_PROVIDER_SITE_OTHER)
Admission: RE | Admit: 2016-08-12 | Discharge: 2016-08-12 | Disposition: A | Discharge status: Home / Self Care | Payer: BC Managed Care – PPO | Source: Ambulatory Visit | Attending: Family Medicine | Admitting: Family Medicine

## 2016-08-12 DIAGNOSIS — J329 Chronic sinusitis, unspecified: Secondary | ICD-10-CM | POA: Diagnosis not present

## 2016-08-12 DIAGNOSIS — J324 Chronic pansinusitis: Secondary | ICD-10-CM

## 2016-08-12 NOTE — Telephone Encounter (Signed)
Pt just had ct sinus scan  and would like to know what is the next step

## 2016-08-13 ENCOUNTER — Encounter: Payer: Self-pay | Admitting: Family Medicine

## 2016-08-14 NOTE — Telephone Encounter (Signed)
Pt is aware that scan has not been reviewed and that I will call once results are back.

## 2016-08-14 NOTE — Telephone Encounter (Signed)
Scan has been reviewed and I sent note to pt via "My Chart" yesterday morning.Edwin [name].   Unfortunately, many patients are apparently not checking those messages regularly.  He has pansinusitis and I recommend referral [facility] Cl[facility]- if pt agreeable with that.

## 2016-08-17 NOTE — Telephone Encounter (Signed)
Pt is aware of results and that order is entered.

## 2016-08-17 NOTE — Addendum Note (Signed)
Addended by: Tempie HoistMCNEIL, Tierre Netto M on: 08/17/2016 09:40 AM   Modules accepted: Orders

## 2016-08-19 ENCOUNTER — Telehealth: Payer: Self-pay | Admitting: Family Medicine

## 2016-08-19 NOTE — Telephone Encounter (Signed)
CT results faxed to Dr. Betsey Amenlinger.

## 2016-08-19 NOTE — Telephone Encounter (Signed)
Pt need for CT sent to Dr. Betsey Amenlinger alone with the referral pt has a appointment on 2/15 and Dr. Betsey Amenlinger would need it before then.

## 2016-12-02 HISTORY — PX: OTHER SURGICAL HISTORY: SHX169

## 2017-01-22 ENCOUNTER — Encounter: Payer: Self-pay | Admitting: Family Medicine

## 2017-01-22 ENCOUNTER — Ambulatory Visit (INDEPENDENT_AMBULATORY_CARE_PROVIDER_SITE_OTHER): Payer: BC Managed Care – PPO | Admitting: Family Medicine

## 2017-01-22 VITALS — BP 110/70 | HR 65 | Temp 98.3°F | Wt 181.0 lb

## 2017-01-22 DIAGNOSIS — N529 Male erectile dysfunction, unspecified: Secondary | ICD-10-CM

## 2017-01-22 DIAGNOSIS — L270 Generalized skin eruption due to drugs and medicaments taken internally: Secondary | ICD-10-CM | POA: Diagnosis not present

## 2017-01-22 MED ORDER — SILDENAFIL CITRATE 20 MG PO TABS: ORAL_TABLET | ORAL | 3 refills | Status: DC

## 2017-01-22 MED ORDER — FLUTICASONE PROPIONATE 50 MCG/ACT NA SUSP: 1.0000 | Freq: Every day | NASAL | 11 refills | Status: DC

## 2017-01-22 MED ORDER — AZELASTINE HCL 0.1 % NA SOLN: 1.0000 | Freq: Every day | NASAL | 11 refills | Status: DC

## 2017-01-22 NOTE — Progress Notes (Signed)
Subjective:     Patient ID: Edwin [name], male   DOB: Nov 11, 1955, 61 y.o.   MRN: [id number]  HPI Patient is preparing to have sinus surgery July 23. He has small cut right lateral leg couple weeks ago. He used some Neosporin and subsequently had some redness around the region with mild itching. Has not been applying anything topically at this point. No fevers or chills. No history of MRSA.  Patient also requesting refills of Viagra. He would like to consider generic version.  Past Medical History:  Diagnosis Date  . Allergy   . Anginal pain (HCC)    during last year has had stress and ECHO test preformed  . Arthritis   . Family history of adverse reaction to anesthesia    pts father had back surgery 10 years ago developed dementia   . Sinus infection    Past Surgical History:  Procedure Laterality Date  . KNEE SURGERY  2009   microfracture surgery; left   . ROOT CANAL    . TONSILLECTOMY    . 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 His smoking use included Cigarettes. He has a 5.00 pack-year smoking history. He has never used smokeless tobacco. He reports that he drinks about 2.0 oz of alcohol per week . He reports that he does not use drugs. family history includes CAD in his brother; CAD (age of onset: 5250) in his paternal uncle; CAD (age of onset: 156) in his brother; Dementia in his father; Hyperlipidemia in his father; Multiple sclerosis in his brother. No Known Allergies   Review of Systems  Constitutional: Negative for chills and fever.       Objective:   Physical Exam  Constitutional: He appears well-developed and well-nourished.  Skin:  Patient has healing very superficial type abrasion right lateral leg. He has some mild surrounding erythema but this is not warm to  touch not tender. No fluctuance.       Assessment:     #1 suspected allergic reaction to Neosporin right lateral leg. No signs of cellulitis  #2 erectile dysfunction    Plan:     -Did not see any reason to delay his surgery -Prescription for sildenafil 20 mg 2-5 tablets 1 hour prior sexual activity #50 with 3 refills -Avoid further use of Neosporin or Polysporin.  Kristian CoveyBruce W Ab Leaming MD [facility]

## 2017-02-03 ENCOUNTER — Encounter: Payer: Self-pay | Admitting: Family Medicine

## 2017-02-03 ENCOUNTER — Telehealth: Payer: Self-pay | Admitting: Family Medicine

## 2017-02-03 DIAGNOSIS — N529 Male erectile dysfunction, unspecified: Secondary | ICD-10-CM | POA: Insufficient documentation

## 2017-02-03 NOTE — Telephone Encounter (Signed)
PA for sildenafil 20 mg submitted on Cover My Meds [id number] - key

## 2017-02-05 NOTE — Telephone Encounter (Signed)
Pt notified of denial.  Informed him that [facility] can [facility] at [facility] for a lower cost of either Cialis or Viagra if he holds a membership.  He and his wife hold a joint account.  Would also like to check to see if Dr. Caryl NeverBurchette has any discount cards.

## 2017-02-08 ENCOUNTER — Telehealth: Payer: Self-pay | Admitting: Family Medicine

## 2017-02-08 ENCOUNTER — Encounter: Payer: Self-pay | Admitting: Family Medicine

## 2017-02-08 IMAGING — CT CT HEART SCORING
1 of 3 series · 10 of 20 positions shown, 13 images · non-contrast
Comparison: None.

CLINICAL DATA: Risk stratification

EXAM:
Coronary Calcium Score
TECHNIQUE: The patient was scanned on a Siemens Sensation 16 slice scanner.
Axial non-contrast 3 mm slices were carried out through the heart.
The data set was analyzed on a dedicated work station and scored
using the Agatson method.

[Series 6: st thins for reformat · axial · 0.73mm/px · z∈[-233,-124]mm · 10 of 135 slices shown, 13 images]
[im 13/135  vessel]
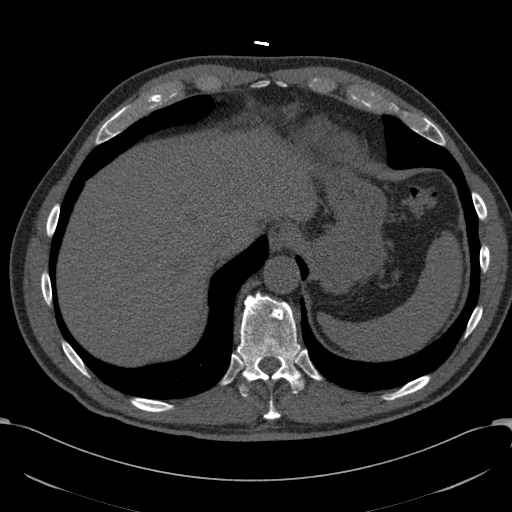
[im 13/135  lung]
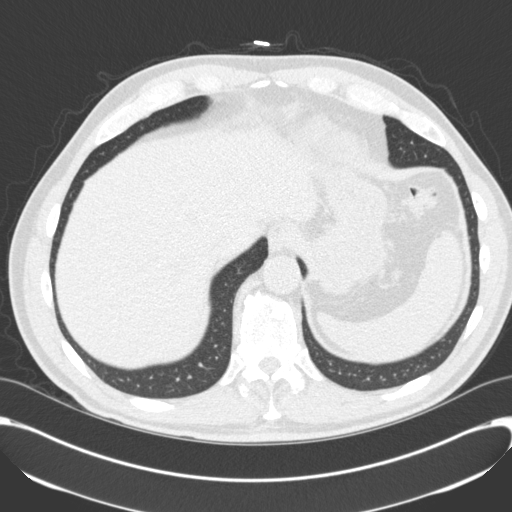
[im 25/135  vessel]
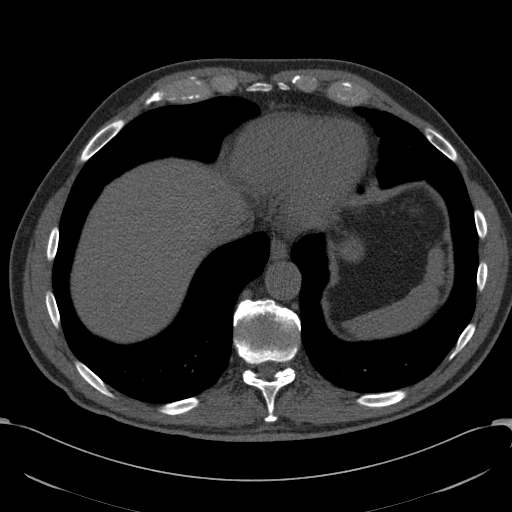
[im 37/135  vessel]
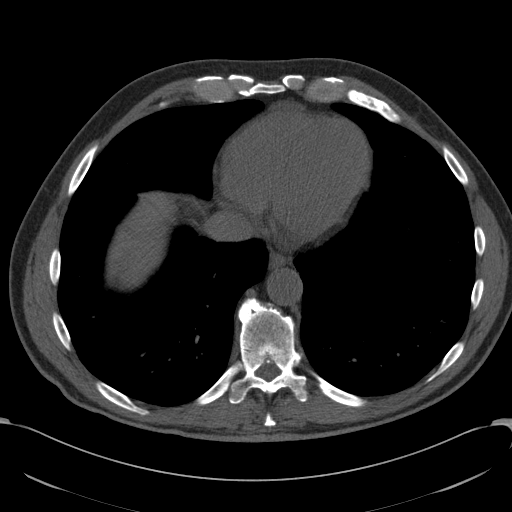
[im 49/135  vessel]
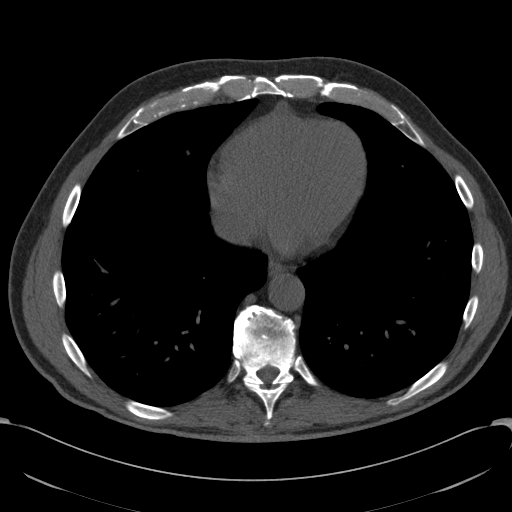
[im 61/135  vessel]
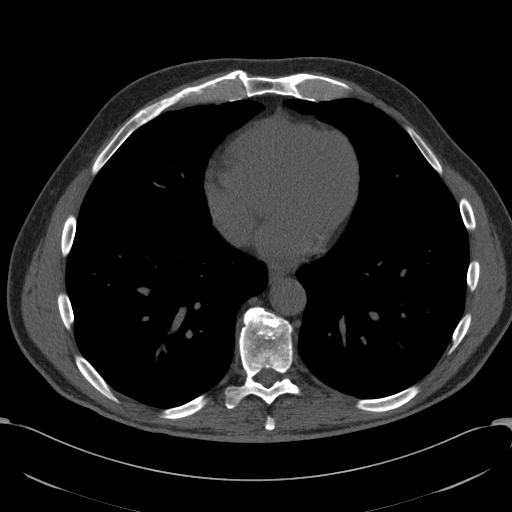
[im 61/135  lung]
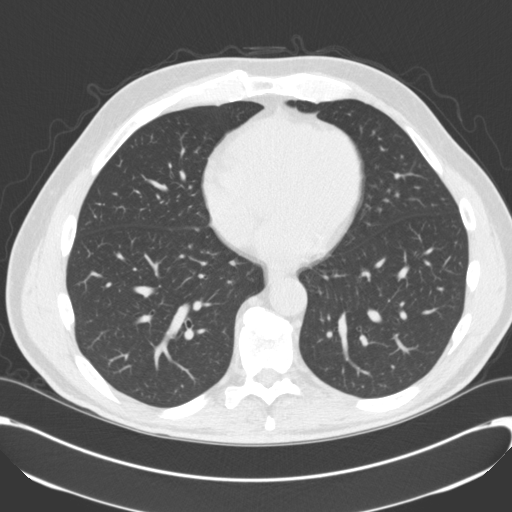
[im 74/135  vessel]
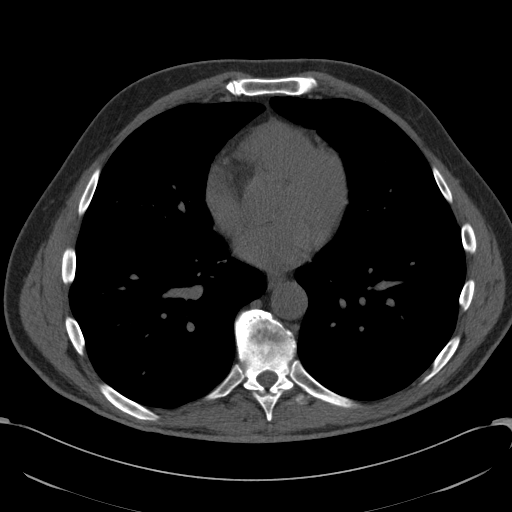
[im 86/135  vessel]
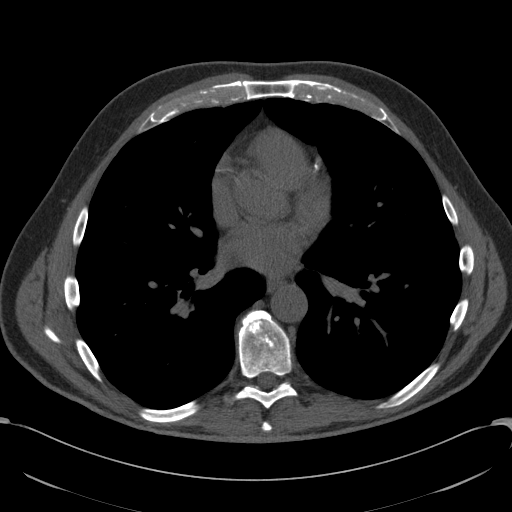
[im 98/135  vessel]
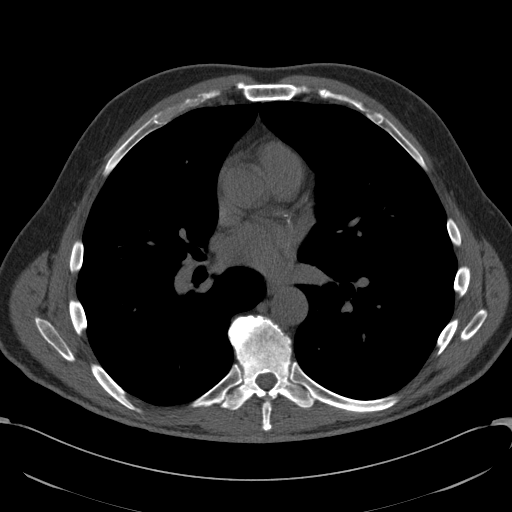
[im 110/135  vessel]
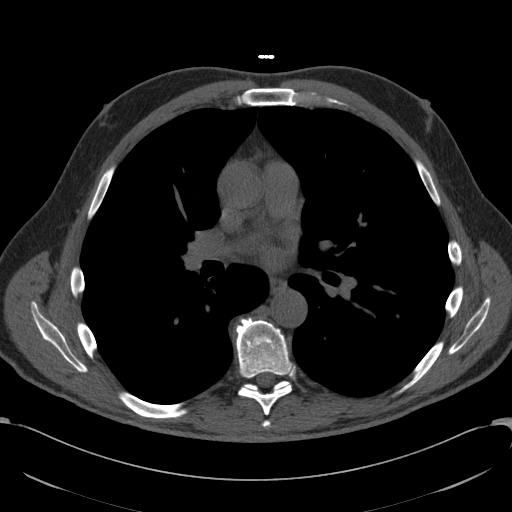
[im 110/135  lung]
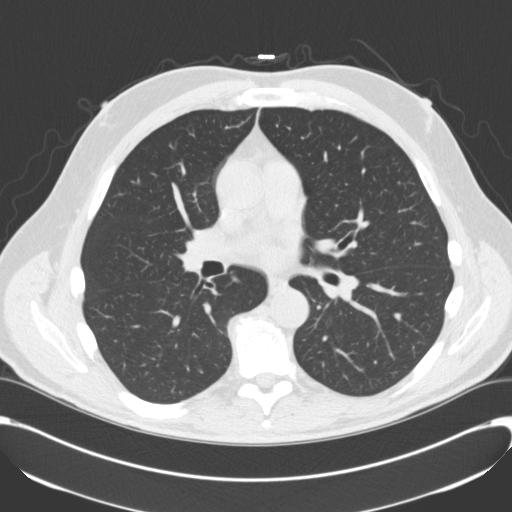
[im 122/135  vessel]
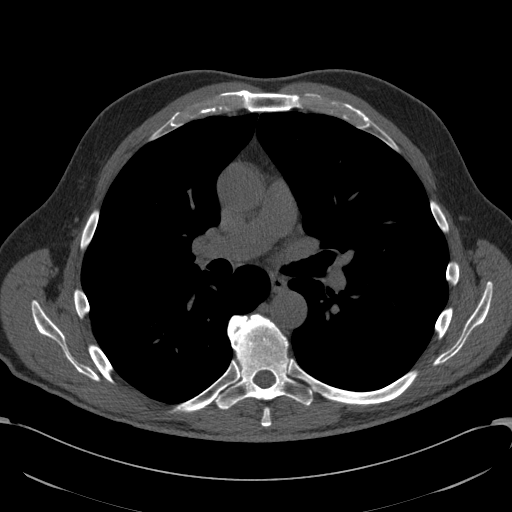

[10 of 20 positions shown; findings below may reference images not displayed]

FINDINGS: Non-cardiac: See separate report from [REDACTED].

Ascending Aorta:  Normal caliber.

Pericardium: Normal

Coronary arteries:  Originating in normal position.
IMPRESSION: Coronary calcium score of 121. This was 70 percentile for age and
sex matched control.

Yocelin Devereaux

EXAM:
OVER-READ INTERPRETATION  CT CHEST

The following report is an over-read performed by radiologist Dr.
over-read does not include interpretation of cardiac or coronary
anatomy or pathology. The coronary calcium score/coronary CTA
interpretation by the cardiologist is attached.
FINDINGS: A few scattered pulmonary nodules measure up to 4 mm in the right
lower lobe (series 4, image 13). Extracardiac structures are
otherwise unremarkable.
IMPRESSION: Scattered pulmonary nodules. If the patient is at high risk for
bronchogenic carcinoma, follow-up chest CT at 1 year is recommended.
If the patient is at low risk, no follow-up is needed. This
recommendation follows the consensus statement: Guidelines for
Management of Small Pulmonary Nodules Detected on CT Scans: A
Statement from the [HOSPITAL] as published in Radiology

## 2017-02-08 MED ORDER — SILDENAFIL CITRATE 20 MG PO TABS: ORAL_TABLET | ORAL | 3 refills | Status: DC

## 2017-02-08 NOTE — Telephone Encounter (Signed)
Rx sent 

## 2017-02-08 NOTE — Telephone Encounter (Signed)
Pt states he received good information from [facility] and would like you to give him a call back

## 2017-02-08 NOTE — Telephone Encounter (Signed)
I do not. Other option is to try [facility] [place]

## 2017-02-08 NOTE — Telephone Encounter (Signed)
Patient will call [facility] and call us back with more information

## 2017-02-08 NOTE — Telephone Encounter (Signed)
Refill sent.

## 2017-04-01 ENCOUNTER — Encounter: Payer: Self-pay | Admitting: Family Medicine

## 2017-04-28 ENCOUNTER — Ambulatory Visit (INDEPENDENT_AMBULATORY_CARE_PROVIDER_SITE_OTHER): Payer: BC Managed Care – PPO | Admitting: Family Medicine

## 2017-04-28 ENCOUNTER — Encounter: Payer: Self-pay | Admitting: Family Medicine

## 2017-04-28 VITALS — BP 110/70 | HR 61 | Temp 98.4°F | Ht 71.0 in | Wt 176.9 lb

## 2017-04-28 DIAGNOSIS — Z125 Encounter for screening for malignant neoplasm of prostate: Secondary | ICD-10-CM | POA: Diagnosis not present

## 2017-04-28 DIAGNOSIS — Z0000 Encounter for general adult medical examination without abnormal findings: Secondary | ICD-10-CM | POA: Diagnosis not present

## 2017-04-28 LAB — LIPID PANEL
CHOLESTEROL: 228 mg/dL — AB (ref 0–200)
HDL: 56.6 mg/dL (ref >39.00)
LDL Cholesterol: 150 mg/dL — ABNORMAL HIGH (ref 0–99)
NonHDL: 171.68
TRIGLYCERIDES: 109 mg/dL (ref 0.0–149.0)
Total CHOL/HDL Ratio: 4
VLDL: 21.8 mg/dL (ref 0.0–40.0)

## 2017-04-28 LAB — BASIC METABOLIC PANEL
BUN: 13 mg/dL (ref 6–23)
CALCIUM: 9.6 mg/dL (ref 8.4–10.5)
CO2: 30 meq/L (ref 19–32)
Chloride: 104 mEq/L (ref 96–112)
Creatinine, Ser: 0.97 mg/dL (ref 0.40–1.50)
GFR: 83.29 mL/min (ref >60.00)
Glucose, Bld: 82 mg/dL (ref 70–99)
Potassium: 4.3 mEq/L (ref 3.5–5.1)
SODIUM: 142 meq/L (ref 135–145)

## 2017-04-28 LAB — CBC WITH DIFFERENTIAL/PLATELET
BASOS PCT: 0.1 % (ref 0.0–3.0)
Basophils Absolute: 0 10*3/uL (ref 0.0–0.1)
EOS PCT: 4.5 % (ref 0.0–5.0)
Eosinophils Absolute: 0.3 10*3/uL (ref 0.0–0.7)
HCT: 46.5 % (ref 39.0–52.0)
Hemoglobin: 16.2 g/dL (ref 13.0–17.0)
LYMPHS ABS: 1.3 10*3/uL (ref 0.7–4.0)
Lymphocytes Relative: 20.6 % (ref 12.0–46.0)
MCHC: 34.7 g/dL (ref 30.0–36.0)
MCV: 98.4 fl (ref 78.0–100.0)
MONOS PCT: 7.2 % (ref 3.0–12.0)
Monocytes Absolute: 0.5 10*3/uL (ref 0.1–1.0)
NEUTROS ABS: 4.3 10*3/uL (ref 1.4–7.7)
NEUTROS PCT: 67.6 % (ref 43.0–77.0)
PLATELETS: 158 10*3/uL (ref 150.0–400.0)
RBC: 4.72 Mil/uL (ref 4.22–5.81)
RDW: 12.9 % (ref 11.5–15.5)
WBC: 6.4 10*3/uL (ref 4.0–10.5)

## 2017-04-28 LAB — HEPATIC FUNCTION PANEL
ALBUMIN: 4.5 g/dL (ref 3.5–5.2)
ALT: 19 U/L (ref 0–53)
AST: 24 U/L (ref 0–37)
Alkaline Phosphatase: 71 U/L (ref 39–117)
Bilirubin, Direct: 0.1 mg/dL (ref 0.0–0.3)
TOTAL PROTEIN: 6.3 g/dL (ref 6.0–8.3)
Total Bilirubin: 0.9 mg/dL (ref 0.2–1.2)

## 2017-04-28 LAB — PSA: PSA: 1.09 ng/mL (ref 0.10–4.00)

## 2017-04-28 LAB — TSH: TSH: 2.25 u[IU]/mL (ref 0.35–4.50)

## 2017-04-28 NOTE — Patient Instructions (Signed)
Let us know if interested in new shingles vaccine (Shingrix).   

## 2017-04-28 NOTE — Progress Notes (Signed)
Subjective:     Patient ID: Edwin [name], male   DOB: [birth year], 61 y.o.   MRN: [id number]  HPI Patient seen for physical exam. He states he feels better than he has in several years. Right total hip replacement last year and is back to playing tennis and that went very well. He had sinus surgery back in May and this apparently was fairly extensive surgery and he is doing much better with regard to sinuses at this time. Still does saline nasal irrigation along with Flonase and every other day Claritin.  He states he had repeat colonoscopy sometime last year. Couple of benign polyps are noted. No history of shingles vaccine. Other immunizations up-to-date.  He is a professor at [employer]. No history of smoking. No regular alcohol use.  Past Medical History:  Diagnosis Date  . Allergy   . Anginal pain (HCC)    during last year has had stress and ECHO test preformed  . Arthritis   . Family history of adverse reaction to anesthesia    pts father had back surgery 10 years ago developed dementia   . Sinus infection    Past Surgical History:  Procedure Laterality Date  . KNEE SURGERY  2009   microfracture surgery; left   . OTHER SURGICAL HISTORY  12/02/2016   sinus   . ROOT CANAL    . TONSILLECTOMY    . 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 His smoking use included Cigarettes. He has a 5.00 pack-year smoking history. He has never used smokeless tobacco. He reports that he drinks about 2.0 oz of alcohol per week . He reports that he does not use drugs. family history includes CAD in his brother; CAD (age of onset: 71) in his paternal uncle; CAD (age of onset: 36) in his brother; Dementia in his father; Hyperlipidemia in his father; Multiple sclerosis in his brother. No Known  Allergies   Review of Systems  Constitutional: Negative for activity change, appetite change, fatigue and fever.  HENT: Negative for congestion, ear pain and trouble swallowing.   Eyes: Negative for pain and visual disturbance.  Respiratory: Negative for cough, shortness of breath and wheezing.   Cardiovascular: Negative for chest pain and palpitations.  Gastrointestinal: Negative for abdominal distention, abdominal pain, blood in stool, constipation, diarrhea, nausea, rectal pain and vomiting.  Genitourinary: Negative for dysuria, hematuria and testicular pain.  Musculoskeletal: Negative for arthralgias and joint swelling.  Skin: Negative for rash.  Neurological: Negative for dizziness, syncope and headaches.  Hematological: Negative for adenopathy.  Psychiatric/Behavioral: Negative for confusion and dysphoric mood.       Objective:   Physical Exam  Constitutional: He is oriented to person, place, and time. He appears well-developed and well-nourished. No distress.  HENT:  Head: Normocephalic and atraumatic.  Right Ear: External ear normal.  Left Ear: External ear normal.  Mouth/Throat: Oropharynx is clear and moist.  Eyes: Pupils are equal, round, and reactive to light. Conjunctivae and EOM are normal.  Neck: Normal range of motion. Neck supple. No thyromegaly present.  Cardiovascular: Normal rate, regular rhythm and normal heart sounds.   No murmur heard. Pulmonary/Chest: No respiratory distress. He has no wheezes. He has no rales.  Abdominal: Soft. Bowel sounds are normal. He exhibits no distension and no mass. There  is no tenderness. There is no rebound and no guarding.  Musculoskeletal: He exhibits no edema.  Lymphadenopathy:    He has no cervical adenopathy.  Neurological: He is alert and oriented to person, place, and time. He displays normal reflexes. No cranial nerve deficit.  Skin: No rash noted.  Psychiatric: He has a normal mood and affect.       Assessment:      Physical exam. Several issues addressed as below    Plan:     -Flu vaccine already given -Discussed new shingles vaccine and at this point he wishes to defer -Obtain screening lab work -The natural history of prostate cancer and ongoing controversy regarding screening and potential treatment outcomes of prostate cancer has been discussed with the patient. The meaning of a false positive PSA and a false negative PSA has been discussed. He indicates understanding of the limitations of this screening test and wishes to proceed with screening PSA testing.  Kristian CoveyBruce W Ivelise Castillo MD [facility]

## 2017-04-29 ENCOUNTER — Encounter: Payer: Self-pay | Admitting: Family Medicine

## 2017-04-30 ENCOUNTER — Encounter: Payer: Self-pay | Admitting: Family Medicine

## 2017-12-30 ENCOUNTER — Telehealth: Payer: Self-pay

## 2017-12-30 NOTE — Telephone Encounter (Signed)
Pt's wife called wanting to know if he husband needs to get an MMR booster shot they plan to go to [facility] July 4th.  Sent to PCP to advise

## 2017-12-31 NOTE — Telephone Encounter (Signed)
Should not. Individuals born < 1958 (per CDC guidelines) are considered low risk.

## 2017-12-31 NOTE — Telephone Encounter (Signed)
Patient is aware 

## 2017-12-31 NOTE — Telephone Encounter (Signed)
Sent to CMA rachel

## 2018-02-23 ENCOUNTER — Other Ambulatory Visit: Payer: Self-pay | Admitting: Family Medicine

## 2018-03-30 ENCOUNTER — Other Ambulatory Visit: Payer: Self-pay | Admitting: Family Medicine

## 2018-04-05 ENCOUNTER — Other Ambulatory Visit: Payer: Self-pay | Admitting: Family Medicine

## 2018-04-13 ENCOUNTER — Encounter: Payer: Self-pay | Admitting: Family Medicine

## 2018-04-13 ENCOUNTER — Ambulatory Visit: Payer: BC Managed Care – PPO | Admitting: Family Medicine

## 2018-04-13 VITALS — BP 110/70 | HR 64 | Temp 98.1°F | Wt 184.7 lb

## 2018-04-13 DIAGNOSIS — J019 Acute sinusitis, unspecified: Secondary | ICD-10-CM | POA: Diagnosis not present

## 2018-04-13 MED ORDER — AMOXICILLIN-POT CLAVULANATE 875-125 MG PO TABS: 1.0000 | ORAL_TABLET | Freq: Two times a day (BID) | ORAL | 0 refills | Status: DC

## 2018-04-13 MED ORDER — PREDNISONE 20 MG PO TABS: ORAL_TABLET | ORAL | 0 refills | Status: DC

## 2018-04-13 NOTE — Progress Notes (Signed)
  Subjective:     Patient ID: Edwin [name], male   DOB: 1955-12-08, 62 y.o.   MRN: [id number]  HPI Patient is seen with onset of illness little over week ago.  Initial sore throat.  He has since then developed some cough and headache and progressive sinus congestion.  He said some thick greenish colored nasal mucus.  He has had frequent sinusitis in the past.  He had sinus surgery over a year ago at [facility] and is done very well with no acute sinusitis episodes since then until now.  He had very difficult time getting over sinusitis episodes in the past.  No fever.  No chills.  Increased malaise.  Past Medical History:  Diagnosis Date  . Allergy   . Anginal pain (HCC)    during last year has had stress and ECHO test preformed  . Arthritis   . Family history of adverse reaction to anesthesia    pts father had back surgery 10 years ago developed dementia   . Sinus infection    Past Surgical History:  Procedure Laterality Date  . KNEE SURGERY  2009   microfracture surgery; left   . OTHER SURGICAL HISTORY  12/02/2016   sinus   . ROOT CANAL    . TONSILLECTOMY    . 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 His smoking use included cigarettes. He has a 5.00 pack-year smoking history. He has never used smokeless tobacco. He reports that he drinks about 4.0 standard drinks of alcohol per week. He reports that he does not use drugs. family history includes CAD in his brother; CAD (age of onset: 68) in his paternal uncle; CAD (age of onset: 57) in his brother; Dementia in his father; Hyperlipidemia in his father; Multiple sclerosis in his brother. No Known Allergies   Review of Systems  Constitutional: Positive for fatigue. Negative for chills and fever.  HENT: Positive for congestion,  sinus pressure and sinus pain.   Respiratory: Positive for cough. Negative for shortness of breath and wheezing.   Cardiovascular: Negative for chest pain.       Objective:   Physical Exam  Constitutional: He appears well-developed and well-nourished.  HENT:  Right Ear: External ear normal.  Left Ear: External ear normal.  Mouth/Throat: Oropharynx is clear and moist.  Erythematous nasal mucosa with thick yellowish nasal mucus bilaterally  Neck: Neck supple.  Cardiovascular: Normal rate and regular rhythm.  Pulmonary/Chest: Effort normal and breath sounds normal. He has no wheezes. He has no rales.  Lymphadenopathy:    He has no cervical adenopathy.       Assessment:     Probable recurrent episode of acute sinusitis.  He has had some chronic difficulties in the past overall though improved following sinus surgery.    Plan:     -Augmentin 875 mg twice daily with food for 10 days -Prednisone 20 mg 2 tablets daily for 5 days -Continue with saline nasal irrigation.  Touch base if not improving over the next few days  Kristian Covey MD [facility] at [facility]

## 2018-05-04 ENCOUNTER — Ambulatory Visit (INDEPENDENT_AMBULATORY_CARE_PROVIDER_SITE_OTHER): Payer: BC Managed Care – PPO | Admitting: Family Medicine

## 2018-05-04 ENCOUNTER — Other Ambulatory Visit: Payer: Self-pay

## 2018-05-04 ENCOUNTER — Encounter: Payer: Self-pay | Admitting: Family Medicine

## 2018-05-04 VITALS — BP 118/76 | HR 75 | Temp 98.1°F | Ht 68.25 in | Wt 186.6 lb

## 2018-05-04 DIAGNOSIS — Z125 Encounter for screening for malignant neoplasm of prostate: Secondary | ICD-10-CM | POA: Diagnosis not present

## 2018-05-04 DIAGNOSIS — Z0000 Encounter for general adult medical examination without abnormal findings: Secondary | ICD-10-CM | POA: Diagnosis not present

## 2018-05-04 LAB — BASIC METABOLIC PANEL
BUN: 15 mg/dL (ref 6–23)
CO2: 31 mEq/L (ref 19–32)
Calcium: 9.5 mg/dL (ref 8.4–10.5)
Chloride: 104 mEq/L (ref 96–112)
Creatinine, Ser: 1.03 mg/dL (ref 0.40–1.50)
GFR: 77.46 mL/min (ref >60.00)
Glucose, Bld: 53 mg/dL — ABNORMAL LOW (ref 70–99)
POTASSIUM: 4.9 meq/L (ref 3.5–5.1)
SODIUM: 142 meq/L (ref 135–145)

## 2018-05-04 LAB — CBC WITH DIFFERENTIAL/PLATELET
Basophils Absolute: 0 10*3/uL (ref 0.0–0.1)
Basophils Relative: 0.1 % (ref 0.0–3.0)
EOS PCT: 3.5 % (ref 0.0–5.0)
Eosinophils Absolute: 0.3 10*3/uL (ref 0.0–0.7)
HEMATOCRIT: 45.5 % (ref 39.0–52.0)
HEMOGLOBIN: 16 g/dL (ref 13.0–17.0)
Lymphocytes Relative: 13.9 % (ref 12.0–46.0)
Lymphs Abs: 1 10*3/uL (ref 0.7–4.0)
MCHC: 35.3 g/dL (ref 30.0–36.0)
MCV: 97.5 fl (ref 78.0–100.0)
MONO ABS: 0.4 10*3/uL (ref 0.1–1.0)
Monocytes Relative: 5.7 % (ref 3.0–12.0)
NEUTROS ABS: 5.7 10*3/uL (ref 1.4–7.7)
Neutrophils Relative %: 76.8 % (ref 43.0–77.0)
Platelets: 177 10*3/uL (ref 150.0–400.0)
RBC: 4.66 Mil/uL (ref 4.22–5.81)
RDW: 12.8 % (ref 11.5–15.5)
WBC: 7.5 10*3/uL (ref 4.0–10.5)

## 2018-05-04 LAB — LIPID PANEL
CHOLESTEROL: 218 mg/dL — AB (ref 0–200)
HDL: 45.1 mg/dL (ref >39.00)
NonHDL: 173.34
Total CHOL/HDL Ratio: 5
Triglycerides: 255 mg/dL — ABNORMAL HIGH (ref 0.0–149.0)
VLDL: 51 mg/dL — AB (ref 0.0–40.0)

## 2018-05-04 LAB — HEPATIC FUNCTION PANEL
ALBUMIN: 4.4 g/dL (ref 3.5–5.2)
ALT: 24 U/L (ref 0–53)
AST: 24 U/L (ref 0–37)
Alkaline Phosphatase: 82 U/L (ref 39–117)
Bilirubin, Direct: 0.1 mg/dL (ref 0.0–0.3)
TOTAL PROTEIN: 6.3 g/dL (ref 6.0–8.3)
Total Bilirubin: 0.6 mg/dL (ref 0.2–1.2)

## 2018-05-04 LAB — TSH: TSH: 2.97 u[IU]/mL (ref 0.35–4.50)

## 2018-05-04 LAB — LDL CHOLESTEROL, DIRECT: Direct LDL: 142 mg/dL

## 2018-05-04 LAB — PSA: PSA: 1.42 ng/mL (ref 0.10–4.00)

## 2018-05-04 NOTE — Patient Instructions (Signed)
Consider shingles vaccine (Shingrix) and let us know if interested.  

## 2018-05-04 NOTE — Progress Notes (Signed)
Subjective:     Patient ID: Edwin [name], male   DOB: 05-Feb-1956, 62 y.o.   MRN: [id number]  HPI Patient seen for physical exam.  He has history of frequent sinusitis and had sinus surgery couple years ago which has decreased incidence.  He had recent sinus infection few weeks ago and is about 95% improved at this time.  Overall he is pleased with results of his previous sinus surgery.  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  Overall very healthy.  Exercise at least 4 days/week and also plays tennis frequently.  Immunizations up-to-date with exception of shingles vaccine.  He donates blood regularly and has thus had hepatitis C screening previously.  Colonoscopy up-to-date.  Smoked only briefly back in the 87s with about 5-pack-year history  Past Medical History:  Diagnosis Date  . Allergy   . Anginal pain (HCC)    during last year has had stress and ECHO test preformed  . Arthritis   . Family history of adverse reaction to anesthesia    pts father had back surgery 10 years ago developed dementia   . Sinus infection    Past Surgical History:  Procedure Laterality Date  . KNEE SURGERY  2009   microfracture surgery; left   . OTHER SURGICAL HISTORY  12/02/2016   sinus   . ROOT CANAL    . TONSILLECTOMY    . 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 His smoking use included cigarettes. He has a 5.00 pack-year smoking history. He has never used smokeless tobacco. He reports that he drinks about 4.0 standard drinks of alcohol per week. He reports that he does not use drugs. family history includes CAD in his brother; CAD (age of onset: 37) in his paternal uncle; CAD (age of onset: 71) in his brother; Dementia in his father; Hyperlipidemia in his father; Multiple  sclerosis in his brother. Allergies  Allergen Reactions  . Molds & Smuts Shortness Of Breath and Other (See Comments)  . Quercus Robur Shortness Of Breath     Review of Systems  Constitutional: Negative for activity change, appetite change, fatigue and fever.  HENT: Negative for congestion, ear pain and trouble swallowing.   Eyes: Negative for pain and visual disturbance.  Respiratory: Positive for cough. Negative for shortness of breath and wheezing.   Cardiovascular: Negative for chest pain and palpitations.  Gastrointestinal: Negative for abdominal distention, abdominal pain, blood in stool, constipation, diarrhea, nausea, rectal pain and vomiting.  Genitourinary: Negative for dysuria, hematuria and testicular pain.  Musculoskeletal: Negative for arthralgias and joint swelling.  Skin: Negative for rash.  Neurological: Negative for dizziness, syncope and headaches.  Hematological: Negative for adenopathy.  Psychiatric/Behavioral: Negative for confusion and dysphoric mood.       Objective:   Physical Exam  Constitutional: He is oriented to person, place, and time. He appears well-developed and well-nourished. No distress.  HENT:  Head: Normocephalic and atraumatic.  Right Ear: External ear normal.  Left Ear: External ear normal.  Mouth/Throat: Oropharynx is clear and moist.  Only very minimal cerumen in both canals.  Nonobstructing.  Eyes: Pupils are equal, round, and reactive to light. Conjunctivae and EOM are normal.  Neck: Normal range of motion. Neck supple. No thyromegaly present.  Cardiovascular: Normal rate, regular  rhythm and normal heart sounds.  No murmur heard. Pulmonary/Chest: No respiratory distress. He has no wheezes. He has no rales.  Abdominal: Soft. Bowel sounds are normal. He exhibits no distension and no mass. There is no tenderness. There is no rebound and no guarding.  Musculoskeletal: He exhibits no edema.  Lymphadenopathy:    He has no cervical  adenopathy.  Neurological: He is alert and oriented to person, place, and time. He displays normal reflexes. No cranial nerve deficit.  Skin: No rash noted.  Psychiatric: He has a normal mood and affect.       Assessment:     Physical exam.  Generally healthy 62 year old male.  Health maintenance issues were addressed as below    Plan:     -Obtain screening lab work -Discussed shingles vaccine and he will consider and check on insurance coverage -Continue regular exercise habits  Kristian Covey MD [facility] at [facility]

## 2018-06-25 ENCOUNTER — Other Ambulatory Visit: Payer: Self-pay | Admitting: Family Medicine

## 2018-06-27 ENCOUNTER — Other Ambulatory Visit: Payer: Self-pay | Admitting: Family Medicine

## 2018-06-27 MED ORDER — FLUTICASONE PROPIONATE 50 MCG/ACT NA SUSP: NASAL | 11 refills | Status: DC

## 2018-06-27 NOTE — Telephone Encounter (Signed)
Copied from CRM [id number]. Topic: Quick Communication - Rx Refill/Question >> Jun 27, 2018  9:05 AM Angela NevinWilliams, Candice N wrote: Medication: fluticasone (FLONASE) 50 MCG/ACT nasal spray   Patient is requesting a refill of this medication sent to pharmacy for 12 months stating he uses it daily and was advised by pharmacy he is on his last refill. Please advise.   Preferred Pharmacy (with phone number or street name):[id number] - [place] - [id number] [facility] [address] [phone] (Phone) [phone] (Fax)

## 2018-07-01 ENCOUNTER — Ambulatory Visit: Payer: Self-pay

## 2018-07-01 MED ORDER — AMOXICILLIN-POT CLAVULANATE 875-125 MG PO TABS: 1.0000 | ORAL_TABLET | Freq: Two times a day (BID) | ORAL | 0 refills | Status: AC

## 2018-07-01 NOTE — Telephone Encounter (Signed)
Please see message.  Please advise. 

## 2018-07-01 NOTE — Telephone Encounter (Signed)
He does have hx of complicated sinus infections and prior hx of sinus surgery.  If symptoms only 2 days, would probably try to observe for now.  However, if he is going out out  [place] would be OK to send in Augmentin 875 mg po bid for 10 days.

## 2018-07-01 NOTE — Telephone Encounter (Signed)
   Reason for Disposition . Cough  Answer Assessment - Initial Assessment Questions 1. ONSET: "When did the cough begin?" 2 days ag     2. SEVERITY: "How bad is the cough today?"      Mild  Breathing bigger prom 3. RESPIRATORY DISTRESS: "Describe your breathing."      *see note 4. FEVER: "Do you have a fever?" If so, ask: "What is your temperature, how was it measured, and when did it start?"     dont think so 5. SPUTUM: "Describe the color of your sputum" (clear, white, yellow, green)     green 6. HEMOPTYSIS: "Are you coughing up any blood?" If so ask: "How much?" (flecks, streaks, tablespoons, etc.)     No blood just green mucu s thicker 7. CARDIAC HISTORY: "Do you have any history of heart disease?" (e.g., heart attack, congestive heart failure)      no 8. LUNG HISTORY: "Do you have any history of lung disease?"  (e.g., pulmonary embolus, asthma, emphysema)     no 9. PE RISK FACTORS: "Do you have a history of blood clots?" (or: recent major surgery, recent prolonged travel, bedridden)     no 10. OTHER SYMPTOMS: "Do you have any other symptoms?" (e.g., runny nose, wheezing, chest pain)       *No Answer* 11. PREGNANCY: "Is there any chance you are pregnant?" "When was your last menstrual period?"       na 12. TRAVEL: "Have you traveled out of the country in the last month?" (e.g., travel history, exposures)       na  Protocols used: COUGH - ACUTE PRODUCTIVE-A-AH

## 2018-07-01 NOTE — Telephone Encounter (Signed)
Outgoing call to Patient.  Patient complains of cough, cant breathe out of right nostril, , sinus headache,  Bringing up green mucous.  Denies fever.  Denies hemoptysis.   States that he will be going out of town,  early Sat am.  [facility], [facility] [address]  Patient states that he has not taken anything OTC.  Placed a call to office.  That I will be sending telephone encounter to office. Patient awaits hearing back from PCP.

## 2018-07-01 NOTE — Telephone Encounter (Signed)
Called patient and let him know that I have sent this to the pharmacy and gave him Dr. Mar DaringBurchettes message. Patient verbalized an understanding and stated that he would wait before taking and only take if his symptoms become worse.

## 2018-10-05 IMAGING — DX DG CHEST 2V
2 series · 2 of 2 positions shown · non-contrast
Comparison: Cardiac CTA 12/19/2014 and earlier.

CLINICAL DATA: 60-year-old male with chronic cough for 2 months and
left side chest discomfort. Initial encounter.

EXAM:
CHEST  2 VIEW

[chest pa]
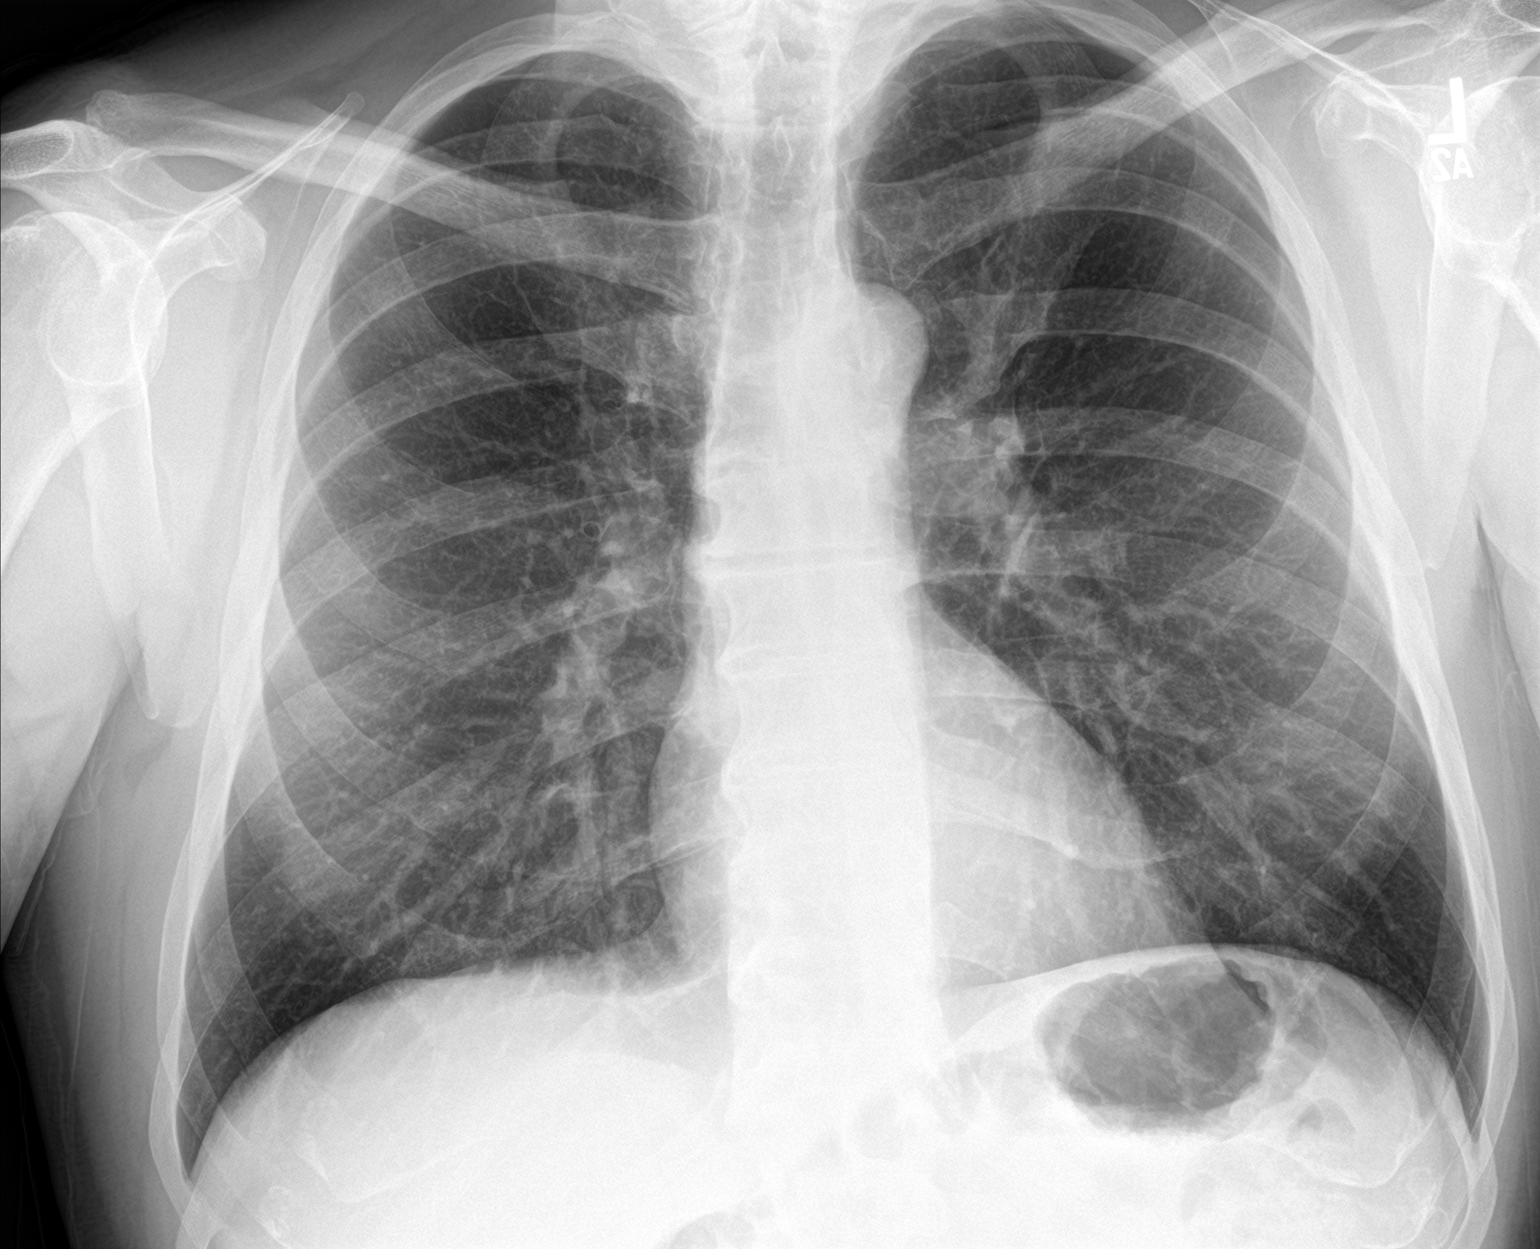

[chest lat]
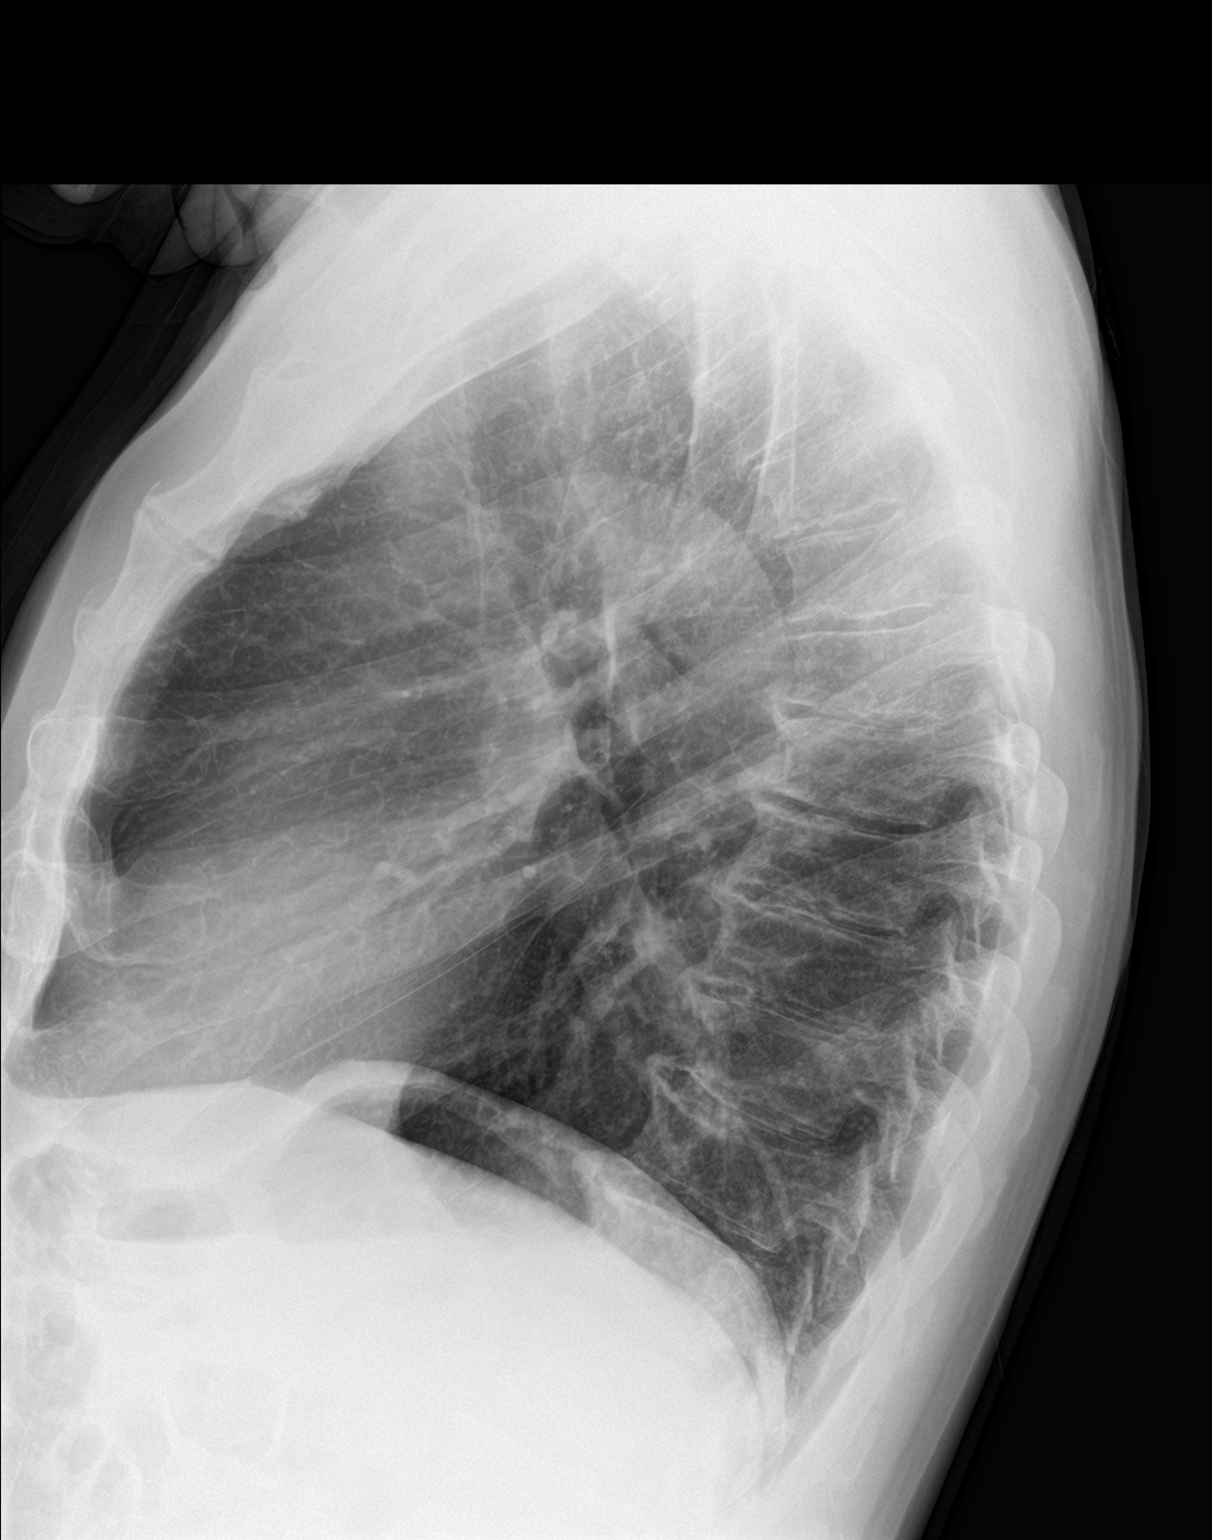

[2 of 2 positions shown; findings below may reference images not displayed]

FINDINGS: Normal cardiac size and mediastinal contours. Lung volumes are
stable at the upper limits of normal to mildly hyperinflated. No
pneumothorax, pulmonary edema, pleural effusion or confluent
pulmonary opacity. Widespread endplate spurring in the thoracic
spine. No acute osseous abnormality identified. Negative visible
bowel gas pattern.
IMPRESSION: No acute cardiopulmonary abnormality.

## 2019-08-25 ENCOUNTER — Other Ambulatory Visit: Payer: Self-pay | Admitting: Family Medicine

## 2019-09-08 ENCOUNTER — Telehealth: Payer: Self-pay | Admitting: Family Medicine

## 2019-09-08 ENCOUNTER — Other Ambulatory Visit: Payer: Self-pay | Admitting: *Deleted

## 2019-09-08 MED ORDER — FLUTICASONE PROPIONATE 50 MCG/ACT NA SUSP: NASAL | 11 refills | Status: DC

## 2019-09-08 NOTE — Telephone Encounter (Signed)
Refill for 1 year 

## 2019-09-08 NOTE — Telephone Encounter (Signed)
Message sent to PCP for approval 

## 2019-09-08 NOTE — Telephone Encounter (Signed)
Medication Refill: Flonase  Pharmacy: [facility] [address]  Phone:[phone]  Pt refills have expired and would like refills and if he could fill however many is possible for the year.

## 2019-09-08 NOTE — Telephone Encounter (Signed)
Rx sent to pharmacy as requested.

## 2019-10-27 ENCOUNTER — Encounter: Payer: Self-pay | Admitting: Family Medicine

## 2019-10-27 ENCOUNTER — Other Ambulatory Visit: Payer: Self-pay

## 2019-10-27 ENCOUNTER — Ambulatory Visit (INDEPENDENT_AMBULATORY_CARE_PROVIDER_SITE_OTHER): Payer: BC Managed Care – PPO | Admitting: Family Medicine

## 2019-10-27 VITALS — BP 118/72 | HR 65 | Temp 97.6°F | Ht 68.0 in | Wt 185.6 lb

## 2019-10-27 DIAGNOSIS — Z0000 Encounter for general adult medical examination without abnormal findings: Secondary | ICD-10-CM

## 2019-10-27 LAB — CBC WITH DIFFERENTIAL/PLATELET
Basophils Absolute: 0 10*3/uL (ref 0.0–0.1)
Basophils Relative: 0.1 % (ref 0.0–3.0)
Eosinophils Absolute: 0.2 10*3/uL (ref 0.0–0.7)
Eosinophils Relative: 4.2 % (ref 0.0–5.0)
HCT: 46.1 % (ref 39.0–52.0)
Hemoglobin: 16.2 g/dL (ref 13.0–17.0)
Lymphocytes Relative: 20.6 % (ref 12.0–46.0)
Lymphs Abs: 1.2 10*3/uL (ref 0.7–4.0)
MCHC: 35.1 g/dL (ref 30.0–36.0)
MCV: 99.4 fl (ref 78.0–100.0)
Monocytes Absolute: 0.4 10*3/uL (ref 0.1–1.0)
Monocytes Relative: 7.5 % (ref 3.0–12.0)
Neutro Abs: 4 10*3/uL (ref 1.4–7.7)
Neutrophils Relative %: 67.6 % (ref 43.0–77.0)
Platelets: 172 10*3/uL (ref 150.0–400.0)
RBC: 4.64 Mil/uL (ref 4.22–5.81)
RDW: 12.7 % (ref 11.5–15.5)
WBC: 6 10*3/uL (ref 4.0–10.5)

## 2019-10-27 LAB — HEPATIC FUNCTION PANEL
ALT: 20 U/L (ref 0–53)
AST: 27 U/L (ref 0–37)
Albumin: 4.5 g/dL (ref 3.5–5.2)
Alkaline Phosphatase: 74 U/L (ref 39–117)
Bilirubin, Direct: 0.2 mg/dL (ref 0.0–0.3)
Total Bilirubin: 1 mg/dL (ref 0.2–1.2)
Total Protein: 6.2 g/dL (ref 6.0–8.3)

## 2019-10-27 LAB — BASIC METABOLIC PANEL
BUN: 13 mg/dL (ref 6–23)
CO2: 30 mEq/L (ref 19–32)
Calcium: 9.3 mg/dL (ref 8.4–10.5)
Chloride: 104 mEq/L (ref 96–112)
Creatinine, Ser: 0.92 mg/dL (ref 0.40–1.50)
GFR: 82.63 mL/min (ref >60.00)
Glucose, Bld: 87 mg/dL (ref 70–99)
Potassium: 4.3 mEq/L (ref 3.5–5.1)
Sodium: 141 mEq/L (ref 135–145)

## 2019-10-27 LAB — PSA: PSA: 1.42 ng/mL (ref 0.10–4.00)

## 2019-10-27 LAB — LIPID PANEL
Cholesterol: 221 mg/dL — ABNORMAL HIGH (ref 0–200)
HDL: 52.6 mg/dL (ref >39.00)
LDL Cholesterol: 149 mg/dL — ABNORMAL HIGH (ref 0–99)
NonHDL: 168.75
Total CHOL/HDL Ratio: 4
Triglycerides: 101 mg/dL (ref 0.0–149.0)
VLDL: 20.2 mg/dL (ref 0.0–40.0)

## 2019-10-27 LAB — TSH: TSH: 3.15 u[IU]/mL (ref 0.35–4.50)

## 2019-10-27 NOTE — Patient Instructions (Addendum)
Preventive Care 40-64 Years Old, Male Preventive care refers to lifestyle choices and visits with your health care provider that can promote health and wellness. This includes:  A yearly physical exam. This is also called an annual well check.  Regular dental and eye exams.  Immunizations.  Screening for certain conditions.  Healthy lifestyle choices, such as eating a healthy diet, getting regular exercise, not using drugs or products that contain nicotine and tobacco, and limiting alcohol use. What can I expect for my preventive care visit? Physical exam Your health care provider will check:  Height and weight. These may be used to calculate body mass index (BMI), which is a measurement that tells if you are at a healthy weight.  Heart rate and blood pressure.  Your skin for abnormal spots. Counseling Your health care provider may ask you questions about:  Alcohol, tobacco, and drug use.  Emotional well-being.  Home and relationship well-being.  Sexual activity.  Eating habits.  Work and work environment. What immunizations do I need?  Influenza (flu) vaccine  This is recommended every year. Tetanus, diphtheria, and pertussis (Tdap) vaccine  You may need a Td booster every 10 years. Varicella (chickenpox) vaccine  You may need this vaccine if you have not already been vaccinated. Zoster (shingles) vaccine  You may need this after age 60. Measles, mumps, and rubella (MMR) vaccine  You may need at least one dose of MMR if you were born in 1957 or later. You may also need a second dose. Pneumococcal conjugate (PCV13) vaccine  You may need this if you have certain conditions and were not previously vaccinated. Pneumococcal polysaccharide (PPSV23) vaccine  You may need one or two doses if you smoke cigarettes or if you have certain conditions. Meningococcal conjugate (MenACWY) vaccine  You may need this if you have certain conditions. Hepatitis A vaccine   You may need this if you have certain conditions or if you travel or work in places where you may be exposed to hepatitis A. Hepatitis B vaccine  You may need this if you have certain conditions or if you travel or work in places where you may be exposed to hepatitis B. Haemophilus influenzae type b (Hib) vaccine  You may need this if you have certain risk factors. Human papillomavirus (HPV) vaccine  If recommended by your health care provider, you may need three doses over 6 months. You may receive vaccines as individual doses or as more than one vaccine together in one shot (combination vaccines). Talk with your health care provider about the risks and benefits of combination vaccines. What tests do I need? Blood tests  Lipid and cholesterol levels. These may be checked every 5 years, or more frequently if you are over 50 years old.  Hepatitis C test.  Hepatitis B test. Screening  Lung cancer screening. You may have this screening every year starting at age 55 if you have a 30-pack-year history of smoking and currently smoke or have quit within the past 15 years.  Prostate cancer screening. Recommendations will vary depending on your family history and other risks.  Colorectal cancer screening. All adults should have this screening starting at age 50 and continuing until age 75. Your health care provider may recommend screening at age 45 if you are at increased risk. You will have tests every 1-10 years, depending on your results and the type of screening test.  Diabetes screening. This is done by checking your blood sugar (glucose) after you have not eaten   for a while (fasting). You may have this done every 1-3 years.  Sexually transmitted disease (STD) testing. Follow these instructions at home: Eating and drinking  Eat a diet that includes fresh fruits and vegetables, whole grains, lean protein, and low-fat dairy products.  Take vitamin and mineral supplements as recommended  by your health care provider.  Do not drink alcohol if your health care provider tells you not to drink.  If you drink alcohol: ? Limit how much you have to 0-2 drinks a day. ? Be aware of how much alcohol is in your drink. In the [place] one drink equals one 12 oz bottle of beer (355 mL), one 5 oz glass of wine (148 mL), or one 1 oz glass of hard liquor (44 mL). Lifestyle  Take daily care of your teeth and gums.  Stay active. Exercise for at least 30 minutes on 5 or more days each week.  Do not use any products that contain nicotine or tobacco, such as cigarettes, e-cigarettes, and chewing tobacco. If you need help quitting, ask your health care provider.  If you are sexually active, practice safe sex. Use a condom or other form of protection to prevent STIs (sexually transmitted infections).  Talk with your health care provider about taking a low-dose aspirin every day starting at age 53. What's next?  Go to your health care provider once a year for a well check visit.  Ask your health care provider how often you should have your eyes and teeth checked.  Stay up to date on all vaccines. This information is not intended to replace advice given to you by your health care provider. Make sure you discuss any questions you have with your health care provider. Document Revised: 06/23/2018 Document Reviewed: 06/23/2018 Elsevier Patient Education  [place].  Consider Shingles vaccine (Shingrix) as some point this year.

## 2019-10-27 NOTE — Progress Notes (Signed)
Subjective:     Patient ID: Edwin [name], male   DOB: 1955/12/24, 64 y.o.   MRN: [id number]  HPI   Edwin [name] is here for physical.  He had severe problems with recurrent sinusitis and we referred him to ear nose and throat specialist over at [facility].  Following surgery he has done extremely well.  He states he has not had any sinus infections during the past year.  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  He had been seen recently last year over at [facility] for pulsatile tinnitus.  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  He has had some hearing loss and has hearing aids which she does not use consistently  He has had Covid vaccine.  Tetanus up-to-date.  No history of shingles vaccine.  Stays fairly active with walking and exercise.  Past Medical History:  Diagnosis Date  . Allergy   . Anginal pain ([place])    during last year has had stress and ECHO test preformed  . Arthritis   . Family history of adverse reaction to anesthesia    pts father had back surgery 10 years ago developed dementia   . Sinus infection    Past Surgical History:  Procedure Laterality Date  . KNEE SURGERY  2009   microfracture surgery; left   . OTHER SURGICAL HISTORY  12/02/2016   sinus   . ROOT CANAL    . TONSILLECTOMY    . 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 His smoking use included cigarettes. He has a 5.00 pack-year smoking history. He has never used smokeless tobacco. He reports current alcohol use of about 4.0 standard drinks of alcohol per week. He reports that he does not use drugs. family history includes CAD in his brother; CAD (age of onset: 67) in his paternal uncle; CAD  (age of onset: 38) in his brother; Dementia in his father; Hyperlipidemia in his father; Multiple sclerosis in his brother. Allergies  Allergen Reactions  . Molds & Smuts Shortness Of Breath and Other (See Comments)  . Quercus Robur Shortness Of Breath     Review of Systems  Constitutional: Negative for activity change, appetite change, fatigue and fever.  HENT: Negative for congestion, ear pain and trouble swallowing.   Eyes: Negative for pain and visual disturbance.  Respiratory: Negative for cough, shortness of breath and wheezing.   Cardiovascular: Negative for chest pain and palpitations.  Gastrointestinal: Negative for abdominal distention, abdominal pain, blood in stool, constipation, diarrhea, nausea, rectal pain and vomiting.  Endocrine: Negative for polydipsia and polyuria.  Genitourinary: Negative for dysuria, hematuria and testicular pain.  Musculoskeletal: Negative for arthralgias and joint swelling.  Skin: Negative for rash.  Neurological: Negative for dizziness, syncope and headaches.  Hematological: Negative for adenopathy.  Psychiatric/Behavioral: Negative for confusion and dysphoric mood.       Objective:   Physical Exam Constitutional:      General: He is not in acute distress.    Appearance: He is well-developed.  HENT:     Head: Normocephalic and atraumatic.     Right Ear: External ear normal.     Left Ear: External ear normal.  Eyes:  Conjunctiva/sclera: Conjunctivae normal.     Pupils: Pupils are equal, round, and reactive to light.  Neck:     Thyroid: No thyromegaly.  Cardiovascular:     Rate and Rhythm: Normal rate and regular rhythm.     Heart sounds: Normal heart sounds. No murmur.  Pulmonary:     Effort: No respiratory distress.     Breath sounds: No wheezing or rales.  Abdominal:     General: Bowel sounds are normal. There is no distension.     Palpations: Abdomen is soft. There is no mass.     Tenderness: There is no abdominal  tenderness. There is no guarding or rebound.  Musculoskeletal:     Cervical back: Normal range of motion and neck supple.  Lymphadenopathy:     Cervical: No cervical adenopathy.  Skin:    Findings: No rash.  Neurological:     Mental Status: He is alert and oriented to person, place, and time.     Cranial Nerves: No cranial nerve deficit.     Deep Tendon Reflexes: Reflexes normal.        Assessment:     Physical exam.  Generally healthy 64 year old male.  Smoked only briefly and quit way back in 1979.  Does not meet criteria for low-dose CT lung cancer screening.  We discussed the following health maintenance issues    Plan:     -Obtain follow-up screening labs -Covid vaccine already given as above -We have recommend he consider Shingrix vaccine and he will check on insurance coverage -Continue regular exercise habits -We will recommend Pneumovax by next year (at age 29).    Edwin Covey MD [facility] at [facility]

## 2020-03-14 ENCOUNTER — Telehealth: Payer: Self-pay | Admitting: *Deleted

## 2020-03-14 NOTE — Telephone Encounter (Signed)
Patient called after hours line 03/13/2020. Patient reports for the last couple of weeks. His heart is racing a little bit. What should he do

## 2020-03-19 NOTE — Telephone Encounter (Signed)
Set up office follow up as first step.

## 2020-03-19 NOTE — Telephone Encounter (Signed)
Left detailed message informing  of update. 

## 2020-03-21 NOTE — Telephone Encounter (Signed)
Left another detailed message advising pt to call to set up an ov.

## 2020-03-22 NOTE — Telephone Encounter (Signed)
Left message to return phone call.

## 2020-03-22 NOTE — Telephone Encounter (Signed)
Closing note for now 

## 2020-04-08 ENCOUNTER — Other Ambulatory Visit: Payer: BC Managed Care – PPO

## 2020-04-08 DIAGNOSIS — Z20822 Contact with and (suspected) exposure to covid-19: Secondary | ICD-10-CM

## 2020-04-10 LAB — SARS-COV-2, NAA 2 DAY TAT

## 2020-04-10 LAB — NOVEL CORONAVIRUS, NAA: SARS-CoV-2, NAA: NOT DETECTED

## 2020-06-26 ENCOUNTER — Encounter: Payer: Self-pay | Admitting: Family Medicine

## 2020-06-26 ENCOUNTER — Ambulatory Visit (INDEPENDENT_AMBULATORY_CARE_PROVIDER_SITE_OTHER): Payer: BC Managed Care – PPO

## 2020-06-26 ENCOUNTER — Telehealth: Payer: Self-pay

## 2020-06-26 ENCOUNTER — Encounter: Payer: Self-pay | Admitting: *Deleted

## 2020-06-26 ENCOUNTER — Other Ambulatory Visit: Payer: Self-pay

## 2020-06-26 ENCOUNTER — Ambulatory Visit: Payer: BC Managed Care – PPO | Admitting: Family Medicine

## 2020-06-26 VITALS — BP 116/62 | HR 71 | Temp 97.8°F | Ht 68.0 in | Wt 191.2 lb

## 2020-06-26 DIAGNOSIS — R000 Tachycardia, unspecified: Secondary | ICD-10-CM

## 2020-06-26 DIAGNOSIS — R002 Palpitations: Secondary | ICD-10-CM

## 2020-06-26 MED ORDER — METOPROLOL TARTRATE 50 MG PO TABS: ORAL_TABLET | ORAL | 1 refills | Status: DC

## 2020-06-26 NOTE — Telephone Encounter (Signed)
Spoke with pt who states he experienced no symptoms today and is feeling fine at this time.  Reviewed verbal report with pt from [facility] and advised once we receive faxed report cardiologist will review and if any further instructions are needed we will contact pt.    Pt verbalizes understanding and agrees with current plan.

## 2020-06-26 NOTE — Patient Instructions (Signed)

## 2020-06-26 NOTE — Progress Notes (Addendum)
Established Patient Office Visit  Subjective:  Patient ID: Edwin [name], male    DOB: Nov 17, 1955  Age: 64 y.o. MRN: [id number]  CC:  Chief Complaint  Patient presents with  . Tachycardia    X3 weeks    HPI Edwin [name] presents for recent episodes of elevated heart rate past few weeks.  He noted couple of these when exercising but even at rest has had heart rate occasionally up to 165.  No associated chest pain.  No dizziness.  No syncope.  He has no known history of A. fib or PSVT.  He had stress test and echo back in 2016 which were unremarkable.  He drinks about 2 cups of coffee per day but this is his baseline.  He had normal TSH last spring.  Apparently has mother and brother with history of A. fib.  Currently asymptomatic.  He has been having these episodes almost daily for the past few weeks No recent problems with exercise intolerance..  Past Medical History:  Diagnosis Date  . Allergy   . Anginal pain (HCC)    during last year has had stress and ECHO test preformed  . Arthritis   . Family history of adverse reaction to anesthesia    pts father had back surgery 10 years ago developed dementia   . Sinus infection     Past Surgical History:  Procedure Laterality Date  . KNEE SURGERY  2009   microfracture surgery; left   . OTHER SURGICAL HISTORY  12/02/2016   sinus   . ROOT CANAL    . TONSILLECTOMY    . TOTAL HIP ARTHROPLASTY Right 12/02/2015   Procedure: RIGHT TOTAL HIP ARTHROPLASTY ANTERIOR APPROACH;  Surgeon: Ollen Gross, MD;  Location: [facility];  Service: Orthopedics;  Laterality: Right;    Family History  Problem Relation Age of Onset  . Hyperlipidemia Father   . Dementia Father   . Multiple sclerosis Brother   . CAD Brother 56       3 stents  . CAD Brother        70% stenosis in on coronary  . CAD Paternal Uncle 15       Died of MI    Social History   Socioeconomic History  . Marital status: Married    Spouse name: Not on file  . Number  of children: 2  . Years of education: Not on file  . Highest education level: Not on file  Occupational History  . Occupation: Professor of Manufacturing systems engineer  Tobacco Use  . Smoking status: Former Smoker    Packs/day: 1.00    Years: 5.00    Pack years: 5.00    Types: Cigarettes    Quit date: 10/01/1977    Years since quitting: 42.7  . Smokeless tobacco: Never Used  Vaping Use  . Vaping Use: Never used  Substance and Sexual Activity  . Alcohol use: Yes    Alcohol/week: 4.0 standard drinks    Types: 4 Standard drinks or equivalent per week    Comment: beer socially   . Drug use: No  . Sexual activity: Not on file  Other Topics Concern  . Not on file  Social History Narrative  . Not on file   Social Determinants of Health   Financial Resource Strain: Not on file  Food Insecurity: Not on file  Transportation Needs: Not on file  Physical Activity: Not on file  Stress: Not on file  Social Connections: Not on file  Intimate Partner Violence: Not on file    Outpatient Medications Prior to Visit  Medication Sig Dispense Refill  . EPINEPHrine 0.3 mg/0.3 mL IJ SOAJ injection Inject into the muscle.    . fluticasone (FLONASE) 50 MCG/ACT nasal spray SHAKE LIQUID AND USE 1 SPRAY IN EACH NOSTRIL AT BEDTIME 16 g 11  . loratadine (CLARITIN) 10 MG tablet Take 10 mg by mouth every other day.    . Multiple Vitamin (MULTIVITAMIN) tablet Take 1 tablet by mouth daily.    . sildenafil (REVATIO) 20 MG tablet TAKE 2 TO 5 TABLETS BY MOUTH ONE HOUR PRIOR TO SEXUAL ACTIVITY AS NEEDED 50 tablet 3   No facility-administered medications prior to visit.    Allergies  Allergen Reactions  . Molds & Smuts Shortness Of Breath and Other (See Comments)  . Quercus Robur Shortness Of Breath    ROS Review of Systems  Constitutional: Negative for appetite change, chills, fatigue, fever and unexpected weight change.  Eyes: Negative for visual disturbance.  Respiratory: Negative for cough, chest  tightness and shortness of breath.   Cardiovascular: Positive for palpitations. Negative for chest pain and leg swelling.  Neurological: Negative for dizziness, syncope, weakness, light-headedness and headaches.      Objective:    Physical Exam Constitutional:      Appearance: He is well-developed and well-nourished.  HENT:     Right Ear: External ear normal.     Left Ear: External ear normal.     Mouth/Throat:     Mouth: Oropharynx is clear and moist.  Eyes:     Pupils: Pupils are equal, round, and reactive to light.  Neck:     Thyroid: No thyromegaly.  Cardiovascular:     Rate and Rhythm: Normal rate and regular rhythm.  Pulmonary:     Effort: Pulmonary effort is normal. No respiratory distress.     Breath sounds: Normal breath sounds. No wheezing or rales.  Musculoskeletal:        General: No edema.     Cervical back: Neck supple.  Neurological:     Mental Status: He is alert and oriented to person, place, and time.     BP 116/62 (BP Location: Left Arm, Patient Position: Sitting, Cuff Size: Normal)   Pulse 71   Temp 97.8 F (36.6 C) (Oral)   Ht 5\' 8"  (1.727 m)   Wt 191 lb 3.2 oz (86.7 kg)   SpO2 97%   BMI 29.07 kg/m  Wt Readings from Last 3 Encounters:  06/26/20 191 lb 3.2 oz (86.7 kg)  10/27/19 185 lb 9.6 oz (84.2 kg)  05/04/18 186 lb 9.6 oz (84.6 kg)     Health Maintenance Due  Topic Date Due  . COVID-19 Vaccine (1) Never done  . PNA vac Low Risk Adult (1 of 2 - PCV13) Never done  . INFLUENZA VACCINE  02/11/2020    There are no preventive care reminders to display for this patient.  Lab Results  Component Value Date   TSH 3.15 10/27/2019   Lab Results  Component Value Date   WBC 6.0 10/27/2019   HGB 16.2 10/27/2019   HCT 46.1 10/27/2019   MCV 99.4 10/27/2019   PLT 172.0 10/27/2019   Lab Results  Component Value Date   NA 141 10/27/2019   K 4.3 10/27/2019   CO2 30 10/27/2019   GLUCOSE 87 10/27/2019   BUN 13 10/27/2019   CREATININE 0.92  10/27/2019   BILITOT 1.0 10/27/2019   ALKPHOS 74 10/27/2019  AST 27 10/27/2019   ALT 20 10/27/2019   PROT 6.2 10/27/2019   ALBUMIN 4.5 10/27/2019   CALCIUM 9.3 10/27/2019   ANIONGAP 7 12/03/2015   GFR 82.63 10/27/2019   Lab Results  Component Value Date   CHOL 221 (H) 10/27/2019   Lab Results  Component Value Date   HDL 52.60 10/27/2019   Lab Results  Component Value Date   LDLCALC 149 (H) 10/27/2019   Lab Results  Component Value Date   TRIG 101.0 10/27/2019   Lab Results  Component Value Date   CHOLHDL 4 10/27/2019   No results found for: HGBA1C    Assessment & Plan:   Patient reports over the past few weeks intermittent episodes almost daily of palpitations and tachyarrhythmia.  He has noted rate up around 130-160 when checked.  Question is whether he is going in and out of atrial fibrillation/flutter.  Less likely PSVT at rate above.  Symptoms seem to be fairly transient usually lasting less than 1 hour and sometimes several minutes.  -EKG today is unremarkable.  No significant changes compared to prior tracing. -Set up cardiac event monitor to further assess -Be careful not to escalate caffeine use -Follow-up immediately for any chest pain, dizziness, or other concerns. -We did write for limited metoprolol 50 mg 1/2 tablet every 12 hours as needed for any tachyarrhythmias lasting more than several minutes  No orders of the defined types were placed in this encounter.   Follow-up: No follow-ups on file.    Evelena Peat, MD

## 2020-06-26 NOTE — Telephone Encounter (Signed)
Phone call from [place] with [employer] to advise received critical EKG for pt of AFIB with RVR, rate 160 bpm for 18 seconds.  Return to sinus with couplet PAC's and PAC's.  Edwin [name] is faxing report now.  Attempted phone call to pt and left voicemail message to contact triage RN at [phone].

## 2020-06-27 ENCOUNTER — Telehealth: Payer: Self-pay

## 2020-06-27 DIAGNOSIS — I4891 Unspecified atrial fibrillation: Secondary | ICD-10-CM

## 2020-06-27 NOTE — Telephone Encounter (Signed)
Spoke with pt and advised monitor alert from 06/26/2020 reviewed by Dr Sanjuana Kava.  Per Dr Clifton James pt should be seen within the next week to begin anticoagulant.  Appointment scheduled with Dr Hillis Range for 07/01/2020 at 9am.  Pt verbalizes understanding.  Reviewed ED precautions.  Pt agreeable with current plan.

## 2020-06-28 ENCOUNTER — Telehealth: Payer: Self-pay | Admitting: *Deleted

## 2020-06-28 ENCOUNTER — Telehealth: Payer: Self-pay

## 2020-06-28 NOTE — Telephone Encounter (Signed)
Received critical monitor report from [employer], auto triggered: 06/28/20 @ 11:30 am central time  AFib RVR, 160 bpm, lasting one minute Pt reported to monitor company that he was outside exercising at the time.  Pt already scheduled to see EP MD on Monday

## 2020-06-28 NOTE — Telephone Encounter (Signed)
Monitor report received from [employer] that showed A Fib with RVR, HR 160 bpm on 12/16 at 12:37pm. Patient was out walking and was asymptomatic. Report reviewed by DOD, Dr. Eldridge Dace.  Report was received on 12/15 and patient was scheduled to see Dr. Johney Frame on 12/20. Will keep current plan.

## 2020-07-01 ENCOUNTER — Encounter: Payer: Self-pay | Admitting: Internal Medicine

## 2020-07-01 ENCOUNTER — Other Ambulatory Visit: Payer: Self-pay

## 2020-07-01 ENCOUNTER — Ambulatory Visit: Payer: BC Managed Care – PPO | Admitting: Internal Medicine

## 2020-07-01 VITALS — BP 124/80 | HR 71 | Ht 72.0 in | Wt 190.8 lb

## 2020-07-01 DIAGNOSIS — I4891 Unspecified atrial fibrillation: Secondary | ICD-10-CM | POA: Diagnosis not present

## 2020-07-01 NOTE — Patient Instructions (Addendum)
Medication Instructions:  Your physician recommends that you continue on your current medications as directed. Please refer to the Current Medication list given to you today.  *If you need a refill on your cardiac medications before your next appointment, please call your pharmacy*  Lab Work: None ordered.  If you have labs (blood work) drawn today and your tests are completely normal, you will receive your results only by: Marland Kitchen MyChart Message (if you have MyChart) OR . A paper copy in the mail If you have any lab test that is abnormal or we need to change your treatment, we will call you to review the results.  Testing/Procedures: please schedule ECHO Your physician has requested that you have an echocardiogram. Echocardiography is a painless test that uses sound waves to create images of your heart. It provides your doctor with information about the size and shape of your heart and how well your heart's chambers and valves are working. This procedure takes approximately one hour. There are no restrictions for this procedure.    Follow-Up: At [facility], you and your health needs are our priority.  As part of our continuing mission to provide you with exceptional heart care, we have created designated Provider Care Teams.  These Care Teams include your primary Cardiologist (physician) and Advanced Practice Providers (APPs -  Physician Assistants and Nurse Practitioners) who all work together to provide you with the care you need, when you need it.  We recommend signing up for the patient portal called "MyChart".  Sign up information is provided on this After Visit Summary.  MyChart is used to connect with patients for Virtual Visits (Telemedicine).  Patients are able to view lab/test results, encounter notes, upcoming appointments, etc.  Non-urgent messages can be sent to your provider as well.   To learn more about what you can do with MyChart, go to [email or web address].    Your next  appointment:   Your physician wants you to follow-up in: Afib Clinic in beginning of February. They will contact you to schedule.   11/27/20 at 9:30 am with Dr. Johney Frame.    Other Instructions:

## 2020-07-01 NOTE — Progress Notes (Signed)
Electrophysiology Office Note   Date:  07/01/2020   ID:  Edwin [name], DOB June 16, 1956, MRN [id number]  PCP:  Kristian Covey, MD    Primary Electrophysiologist: Hillis Range, MD    CC>: afib   History of Present Illness: Edwin [name] is a 64 y.o. male who presents today for electrophysiology evaluation.   He is referred by Dr Caryl Never for further evaluation of recently discovered atrial arrhythmias. He reports having palpitations over the past month.  These are short and infrequent.  Longest event was about an hour.  He was evaluated by PCP and had an event monitor placed.  This revealed disorganized atrial activity and frequent atrial ectopy.  He has reduced caffeine with substantial improvement in episodes.   Today, he denies symptoms of palpitations, chest pain, shortness of breath, orthopnea, PND, lower extremity edema, claudication, dizziness, presyncope, syncope, bleeding, or neurologic sequela. The patient is tolerating medications without difficulties and is otherwise without complaint today.    Past Medical History:  Diagnosis Date  . Allergy   . Arthritis   . Family history of adverse reaction to anesthesia    pts father had back surgery 10 years ago developed dementia   . Nonobstructive atherosclerosis of coronary artery 2016   cardiac CT  . Sinus infection    Past Surgical History:  Procedure Laterality Date  . KNEE SURGERY  2009   microfracture surgery; left   . OTHER SURGICAL HISTORY  12/02/2016   sinus   . ROOT CANAL    . TONSILLECTOMY    . TOTAL HIP ARTHROPLASTY Right 12/02/2015   Procedure: RIGHT TOTAL HIP ARTHROPLASTY ANTERIOR APPROACH;  Surgeon: Ollen Gross, MD;  Location: [facility];  Service: Orthopedics;  Laterality: Right;     Current Outpatient Medications  Medication Sig Dispense Refill  . EPINEPHrine 0.3 mg/0.3 mL IJ SOAJ injection Inject into the muscle.    . fluticasone (FLONASE) 50 MCG/ACT nasal spray SHAKE LIQUID AND USE 1  SPRAY IN EACH NOSTRIL AT BEDTIME 16 g 11  . loratadine (CLARITIN) 10 MG tablet Take 10 mg by mouth every other day.    . metoprolol tartrate (LOPRESSOR) 50 MG tablet Take one half tablet every 12 hours as needed for palpitations/elevated heart rate 30 tablet 1  . Multiple Vitamin (MULTIVITAMIN) tablet Take 1 tablet by mouth daily.    . sildenafil (REVATIO) 20 MG tablet TAKE 2 TO 5 TABLETS BY MOUTH ONE HOUR PRIOR TO SEXUAL ACTIVITY AS NEEDED 50 tablet 3   No current facility-administered medications for this visit.    Allergies:   Molds & smuts and Quercus robur   Social History:  The patient  reports that he quit smoking about 42 years ago. His smoking use included cigarettes. He has a 5.00 pack-year smoking history. He has never used smokeless tobacco. He reports current alcohol use of about 4.0 standard drinks of alcohol per week. He reports that he does not use drugs.   Family History:  The patient's  family history includes Atrial fibrillation (age of onset: 56) in his brother; Atrial fibrillation (age of onset: 47) in his mother; CAD in his brother; CAD (age of onset: 66) in his paternal uncle; CAD (age of onset: 59) in his brother; Dementia in his father; Hyperlipidemia in his father; Multiple sclerosis in his brother.    ROS:  Please see the history of present illness.   All other systems are personally reviewed and negative.    PHYSICAL EXAM: VS:  BP  124/80   Pulse 71   Ht 6' (1.829 m)   Wt 190 lb 12.8 oz (86.5 kg)   SpO2 98%   BMI 25.88 kg/m  , BMI Body mass index is 25.88 kg/m. GEN: Well nourished, well developed, in no acute distress HEENT: normal Neck: no JVD, carotid bruits, or masses Cardiac: RRR; no murmurs, rubs, or gallops,no edema  Respiratory:  clear to auscultation bilaterally, normal work of breathing GI: soft, nontender, nondistended, + BS MS: no deformity or atrophy Skin: warm and dry  Neuro:  Strength and sensation are intact Psych: euthymic mood, full  affect  EKG:  EKG is ordered today. The ekg ordered today is personally reviewed and shows sinus rhythm, LAHB  Event monitor (still wearing) shows sinus with disorganized atrial activity (no afib > 30 seconds) and frequent atrial ectopy   Recent Labs: 10/27/2019: ALT 20; BUN 13; Creatinine, Ser 0.92; Hemoglobin 16.2; Platelets 172.0; Potassium 4.3; Sodium 141; TSH 3.15  personally reviewed   Lipid Panel     Component Value Date/Time   CHOL 221 (H) 10/27/2019 1145   TRIG 101.0 10/27/2019 1145   HDL 52.60 10/27/2019 1145   CHOLHDL 4 10/27/2019 1145   VLDL 20.2 10/27/2019 1145   LDLCALC 149 (H) 10/27/2019 1145   LDLDIRECT 142.0 05/04/2018 0803   personally reviewed   Wt Readings from Last 3 Encounters:  07/01/20 190 lb 12.8 oz (86.5 kg)  06/26/20 191 lb 3.2 oz (86.7 kg)  10/27/19 185 lb 9.6 oz (84.2 kg)      Other studies personally reviewed: Additional studies/ records that were reviewed today include: prior echo, cardiac CT  Review of the above records today demonstrates: as above   ASSESSMENT AND PLAN:  1.  Paroxysmal atrial fibrillation/ disorganized atrial activity chads2vasc score is 1.  We will therefore not start OAC at this time.  Lifestyle modification discussed at length He has prn metoprolol Avoid excessive caffeine and ETOH Echo is ordered Consider daily diltiazem/ flecainide if his arrhythmias increase.  2. Nonobstructive CAD No obstructive disease by cardiac CT I would be ok using flecainide if his arrhythmias increase  Risks, benefits and potential toxicities for medications prescribed and/or refilled reviewed with patient today.    Follow-up:  afib clinic for further discussions once his monitor is resulted. Return to see me 3 months after AF clinic  Current medicines are reviewed at length with the patient today.   The patient does not have concerns regarding his medicines.  The following changes were made today:  none  Labs/ tests ordered  today include:  Orders Placed This Encounter  Procedures  . EKG 12-Lead     Signed, Hillis Range, MD  07/01/2020 10:06 AM     [facility] [address] [place] [address] [phone] (office) [phone] (fax)

## 2020-07-02 ENCOUNTER — Telehealth: Payer: Self-pay

## 2020-07-02 NOTE — Telephone Encounter (Signed)
-----   Message from Kristian Covey, MD sent at 07/01/2020  8:07 PM EST ----- Pt does have A fib with RVR by monitor.  He has already been seen by cardiology for further evaluation.

## 2020-07-02 NOTE — Telephone Encounter (Signed)
Patient aware of results and has followed up with cardiology.

## 2020-07-08 ENCOUNTER — Telehealth: Payer: Self-pay | Admitting: *Deleted

## 2020-07-08 NOTE — Telephone Encounter (Addendum)
[employer] report received for 07/06/20 Auto Trigger 03:09 am Sinus Bradycardia Pause(3.2Sec) with Atrial Run. Heart rate from 30 to 82.  Patient states he was sleeping during the event and feels okay today.  Of note patient just had visit with Dr. Johney Frame EP and has follow up 08/20/20 with Afib clinic.  Will take to DOD Dr. Mayford Knife for review. Dr. Mayford Knife reviewed strips; Continue to monitor.   Will place strips in Dr. Jenel Lucks box to view when he is back in the office.

## 2020-07-17 ENCOUNTER — Ambulatory Visit (HOSPITAL_COMMUNITY): Payer: BC Managed Care – PPO | Attending: Cardiology

## 2020-07-17 ENCOUNTER — Other Ambulatory Visit: Payer: Self-pay

## 2020-07-17 DIAGNOSIS — I4891 Unspecified atrial fibrillation: Secondary | ICD-10-CM

## 2020-07-17 LAB — ECHOCARDIOGRAM COMPLETE
Area-P 1/2: 2.16 cm2
S' Lateral: 2.3 cm

## 2020-08-13 ENCOUNTER — Other Ambulatory Visit: Payer: Self-pay | Admitting: Family Medicine

## 2020-08-19 NOTE — Progress Notes (Signed)
Primary Care Physician: Kristian Covey, MD Primary Electrophysiologist: Dr Johney Frame Referring Physician: Dr Evonnie Pat is a 65 y.o. male with a history of atrial fibrillation and non obstructive CAD who presents for follow up in the [facility]. The patient was initially diagnosed with atrial fibrillation 06/2020 on a cardiac monitor with his PCP after presenting with symptoms of palpitations. The monitor showed 2% afib burden with RVR. Patient is not on anticoagulation for a CHADS2VASC score of 1. He reports that he has not had any further palpitations since taking off the monitor. He has not used his PRN BB.   Today, he denies symptoms of palpitations, chest pain, shortness of breath, orthopnea, PND, lower extremity edema, dizziness, presyncope, syncope, snoring, daytime somnolence, bleeding, or neurologic sequela. The patient is tolerating medications without difficulties and is otherwise without complaint today.    Atrial Fibrillation Risk Factors:  he does not have symptoms or diagnosis of sleep apnea. he does not have a history of rheumatic fever. he does not have a history of alcohol use. The patient does have a history of early familial atrial fibrillation or other arrhythmias. Mother and brother have afib.   he has a BMI of Body mass index is 26.2 kg/m.Marland Kitchen Filed Weights   08/20/20 0840  Weight: 87.6 kg    Family History  Problem Relation Age of Onset  . Hyperlipidemia Father   . Dementia Father   . Multiple sclerosis Brother   . CAD Brother 56       3 stents  . Atrial fibrillation Brother 56  . CAD Brother        70% stenosis in on coronary  . CAD Paternal Uncle 44       Died of MI  . Atrial fibrillation Mother 59     Atrial Fibrillation Management history:  Previous antiarrhythmic drugs: none Previous cardioversions: none Previous ablations: none CHADS2VASC score: 1 Anticoagulation history: none   Past  Medical History:  Diagnosis Date  . Allergy   . Arthritis   . Family history of adverse reaction to anesthesia    pts father had back surgery 10 years ago developed dementia   . Nonobstructive atherosclerosis of coronary artery 2016   cardiac CT  . Sinus infection    Past Surgical History:  Procedure Laterality Date  . KNEE SURGERY  2009   microfracture surgery; left   . OTHER SURGICAL HISTORY  12/02/2016   sinus   . ROOT CANAL    . TONSILLECTOMY    . TOTAL HIP ARTHROPLASTY Right 12/02/2015   Procedure: RIGHT TOTAL HIP ARTHROPLASTY ANTERIOR APPROACH;  Surgeon: Ollen Gross, MD;  Location: [facility];  Service: Orthopedics;  Laterality: Right;    Current Outpatient Medications  Medication Sig Dispense Refill  . EPINEPHrine 0.3 mg/0.3 mL IJ SOAJ injection Inject into the muscle.    . fluticasone (FLONASE) 50 MCG/ACT nasal spray SHAKE LIQUID AND USE 1 SPRAY IN EACH NOSTRIL AT BEDTIME 16 g 11  . loratadine (CLARITIN) 10 MG tablet Take 10 mg by mouth every other day.    . Multiple Vitamin (MULTIVITAMIN) tablet Take 1 tablet by mouth daily.    . sildenafil (REVATIO) 20 MG tablet TAKE 2 TO 5 TABLETS BY MOUTH ONE HOUR PRIOR TO SEXUAL ACTIVITY AS NEEDED 50 tablet 3  . metoprolol tartrate (LOPRESSOR) 50 MG tablet Take one half tablet every 12 hours as needed for palpitations/elevated heart rate (Patient not taking: Reported  on 08/20/2020) 30 tablet 1   No current facility-administered medications for this encounter.    Allergies  Allergen Reactions  . Molds & Smuts Shortness Of Breath and Other (See Comments)  . Quercus Robur Shortness Of Breath  . [employer] Other (See Comments)    Social History   Socioeconomic History  . Marital status: Married    Spouse name: Not on file  . Number of children: 2  . Years of education: Not on file  . Highest education level: Not on file  Occupational History  . Occupation: Professor of Manufacturing systems engineer  Tobacco Use  . Smoking  status: Former Smoker    Packs/day: 1.00    Years: 5.00    Pack years: 5.00    Types: Cigarettes    Quit date: 10/01/1977    Years since quitting: 42.9  . Smokeless tobacco: Never Used  Vaping Use  . Vaping Use: Never used  Substance and Sexual Activity  . Alcohol use: Yes    Alcohol/week: 4.0 standard drinks    Types: 4 Standard drinks or equivalent per week    Comment: beer socially (2 beers per day on average)  . Drug use: No  . Sexual activity: Not on file  Other Topics Concern  . Not on file  Social History Narrative   Lives in [place] with [place].  Grown daughters (2) healthy   Works as a Airline pilot at [employer],  Licensed conveyancer)   Social Determinants of Corporate investment banker Strain: Not on [employer] Insecurity: Not on file  Transportation Needs: Not on file  Physical Activity: Not on file  Stress: Not on file  Social Connections: Not on file  Intimate Partner Violence: Not on file     ROS- All systems are reviewed and negative except as per the HPI above.  Physical Exam: Vitals:   08/20/20 0840  BP: (!) 160/92  Pulse: 71  Weight: 87.6 kg  Height: 6' (1.829 m)    GEN- The patient is well appearing, alert and oriented x 3 today.   Head- normocephalic, atraumatic Eyes-  Sclera clear, conjunctiva pink Ears- hearing intact Oropharynx- clear Neck- supple  Lungs- Clear to ausculation bilaterally, normal work of breathing Heart- Regular rate and rhythm, no murmurs, rubs or gallops  GI- soft, NT, ND, + BS Extremities- no clubbing, cyanosis, or edema MS- no significant deformity or atrophy Skin- no rash or lesion Psych- euthymic mood, full affect Neuro- strength and sensation are intact  Wt Readings from Last 3 Encounters:  08/20/20 87.6 kg  07/01/20 86.5 kg  06/26/20 86.7 kg    EKG today demonstrates  SR, LAFB, slow R wave prog Vent. rate 71 BPM PR interval 152 ms QRS duration 126 ms QT/QTc 394/428  ms  Echo 07/17/20 demonstrated  1. Left ventricular ejection fraction, by estimation, is 65 to 70%. The  left ventricle has normal function. The left ventricle has no regional  wall motion abnormalities. There is mild left ventricular hypertrophy.  Left ventricular diastolic parameters  are consistent with Grade I diastolic dysfunction (impaired relaxation).  2. Right ventricular systolic function is normal. The right ventricular  size is normal.  3. The mitral valve is normal in structure. Trivial mitral valve  regurgitation. No evidence of mitral stenosis.  4. The aortic valve is normal in structure. Aortic valve regurgitation is  not visualized. No aortic stenosis is present.  5. The inferior vena cava is normal in size with  greater than 50%  respiratory variability, suggesting right atrial pressure of 3 mmHg.   Epic records are reviewed at length today  CHA2DS2-VASc Score = 1  The patient's score is based upon: CHF History: No HTN History: No Diabetes History: No Stroke History: No Vascular Disease History: No Age Score: 1 Gender Score: 0      ASSESSMENT AND PLAN: 1. Paroxysmal Atrial Fibrillation The patient's CHA2DS2-VASc score is 1, indicating a 0.6% annual risk of stroke.   Cardiac monitor showed 2% afib burden. We discussed therapeutic options including AAD (flecainide) vs ablation. He is doing well with no episodes for several weeks so we will continue present therapy for now. Patient in agreement with plan. Continue Lopressor 25 mg BID PRN for heart racing.    Follow up with Dr Johney Frame as scheduled.    Jorja Loa PA-C [facility] [address] [place], [place] [address] [phone] 08/20/2020 9:03 AM

## 2020-08-20 ENCOUNTER — Ambulatory Visit (HOSPITAL_COMMUNITY)
Admission: RE | Admit: 2020-08-20 | Discharge: 2020-08-20 | Disposition: A | Discharge status: Home / Self Care | Payer: BC Managed Care – PPO | Source: Ambulatory Visit | Attending: Physician Assistant | Admitting: Physician Assistant

## 2020-08-20 ENCOUNTER — Other Ambulatory Visit: Payer: Self-pay

## 2020-08-20 VITALS — BP 160/92 | HR 71 | Ht 72.0 in | Wt 193.2 lb

## 2020-08-20 DIAGNOSIS — Z96641 Presence of right artificial hip joint: Secondary | ICD-10-CM | POA: Insufficient documentation

## 2020-08-20 DIAGNOSIS — I2510 Atherosclerotic heart disease of native coronary artery without angina pectoris: Secondary | ICD-10-CM | POA: Diagnosis not present

## 2020-08-20 DIAGNOSIS — Z87891 Personal history of nicotine dependence: Secondary | ICD-10-CM | POA: Diagnosis not present

## 2020-08-20 DIAGNOSIS — Z79899 Other long term (current) drug therapy: Secondary | ICD-10-CM | POA: Diagnosis not present

## 2020-08-20 DIAGNOSIS — I480 Paroxysmal atrial fibrillation: Secondary | ICD-10-CM | POA: Diagnosis not present

## 2020-10-07 ENCOUNTER — Other Ambulatory Visit: Payer: Self-pay | Admitting: Family Medicine

## 2020-11-18 ENCOUNTER — Ambulatory Visit: Payer: BC Managed Care – PPO | Admitting: Internal Medicine

## 2020-11-27 ENCOUNTER — Ambulatory Visit: Payer: BC Managed Care – PPO | Admitting: Internal Medicine

## 2020-12-04 ENCOUNTER — Ambulatory Visit: Payer: BC Managed Care – PPO | Admitting: Internal Medicine

## 2020-12-04 ENCOUNTER — Encounter: Payer: Self-pay | Admitting: Internal Medicine

## 2020-12-04 ENCOUNTER — Other Ambulatory Visit: Payer: Self-pay

## 2020-12-04 ENCOUNTER — Telehealth: Payer: Self-pay | Admitting: Family Medicine

## 2020-12-04 VITALS — BP 132/78 | HR 74 | Ht 72.0 in | Wt 192.2 lb

## 2020-12-04 DIAGNOSIS — I480 Paroxysmal atrial fibrillation: Secondary | ICD-10-CM

## 2020-12-04 NOTE — Patient Instructions (Addendum)
Medication Instructions:  Your physician recommends that you continue on your current medications as directed. Please refer to the Current Medication list given to you today.  Labwork: None ordered.  Testing/Procedures: None ordered.  Follow-Up: Your physician wants you to follow-up in: 6 months with James Allred, MD     You will receive a reminder letter in the mail two months in advance. If you don't receive a letter, please call our office to schedule the follow-up appointment.   Any Other Special Instructions Will Be Listed Below (If Applicable).  If you need a refill on your cardiac medications before your next appointment, please call your pharmacy.        

## 2020-12-04 NOTE — Telephone Encounter (Signed)
Pt is scheduled for his physical visit with Dr. Caryl Never on 12/31/2020@10 :30; pt is requesting a shingle shot at his visit.

## 2020-12-04 NOTE — Progress Notes (Signed)
PCP: Kristian Covey, MD   Primary EP: Dr Evonnie Pat is a 65 y.o. male who presents today for routine electrophysiology followup.  Since last being seen in our clinic, the patient reports doing very well.  He continues to have palpitations but remains active. Today, he denies symptoms of chest pain, shortness of breath,  lower extremity edema, dizziness, presyncope, or syncope.  The patient is otherwise without complaint today.   Past Medical History:  Diagnosis Date  . Allergy   . Arthritis   . Family history of adverse reaction to anesthesia    pts father had back surgery 10 years ago developed dementia   . Nonobstructive atherosclerosis of coronary artery 2016   cardiac CT  . Sinus infection    Past Surgical History:  Procedure Laterality Date  . KNEE SURGERY  2009   microfracture surgery; left   . OTHER SURGICAL HISTORY  12/02/2016   sinus   . ROOT CANAL    . TONSILLECTOMY    . TOTAL HIP ARTHROPLASTY Right 12/02/2015   Procedure: RIGHT TOTAL HIP ARTHROPLASTY ANTERIOR APPROACH;  Surgeon: Ollen Gross, MD;  Location: [facility];  Service: Orthopedics;  Laterality: Right;    ROS- all systems are reviewed and negatives except as per HPI above  Current Outpatient Medications  Medication Sig Dispense Refill  . budesonide (PULMICORT) 0.5 MG/2ML nebulizer solution Take 0.5 mg by nebulization as needed. Use as directed    . EPINEPHrine 0.3 mg/0.3 mL IJ SOAJ injection Inject into the muscle.    . fluticasone (FLONASE) 50 MCG/ACT nasal spray SHAKE LIQUID AND USE 1 SPRAY IN EACH NOSTRIL AT BEDTIME 16 g 11  . loratadine (CLARITIN) 10 MG tablet Take 10 mg by mouth every other day.    . metoprolol tartrate (LOPRESSOR) 50 MG tablet Take one half tablet every 12 hours as needed for palpitations/elevated heart rate 30 tablet 1  . Multiple Vitamin (MULTIVITAMIN) tablet Take 1 tablet by mouth daily.    . sildenafil (REVATIO) 20 MG tablet TAKE 2 TO 5 TABLETS BY MOUTH ONE HOUR  PRIOR TO SEXUAL ACTIVITY AS NEEDED 50 tablet 3   No current facility-administered medications for this visit.    Physical Exam: Vitals:   12/04/20 1021  BP: 132/78  Pulse: 74  SpO2: 98%  Weight: 192 lb 3.2 oz (87.2 kg)  Height: 6' (1.829 m)    GEN- The patient is well appearing, alert and oriented x 3 today.   Head- normocephalic, atraumatic Eyes-  Sclera clear, conjunctiva pink Ears- hearing intact Oropharynx- clear Lungs- Clear to ausculation bilaterally, normal work of breathing Heart- Regular rate and rhythm, no murmurs, rubs or gallops, PMI not laterally displaced GI- soft, NT, ND, + BS Extremities- no clubbing, cyanosis, or edema  Wt Readings from Last 3 Encounters:  12/04/20 192 lb 3.2 oz (87.2 kg)  08/20/20 193 lb 3.2 oz (87.6 kg)  07/01/20 190 lb 12.8 oz (86.5 kg)   Echo 08/20/20- EF 65%, mild LVH  EKG tracing ordered today is personally reviewed and shows sinus with LAHB  Assessment and Plan:  1. Paroxysmal atrial fibrillation/ disorganized atrial activity chads2vasc score is 1 Monitor revealed 2% AF burden We discussed AADs (flecainide) and ablation as options today.  He wishes to continue a more conservative approach at this time.  2. Nonobstructive CAD Stable No change required today  No changes  Return to see me in 6 months  Hillis Range MD, [facility] 12/04/2020 10:47 AM

## 2020-12-04 NOTE — Telephone Encounter (Signed)
Noted. Will send to Dr. Burchette as an FYI. 

## 2020-12-04 NOTE — Telephone Encounter (Signed)
noted 

## 2020-12-31 ENCOUNTER — Encounter: Payer: Self-pay | Admitting: Family Medicine

## 2020-12-31 ENCOUNTER — Ambulatory Visit (INDEPENDENT_AMBULATORY_CARE_PROVIDER_SITE_OTHER): Payer: BC Managed Care – PPO | Admitting: Family Medicine

## 2020-12-31 ENCOUNTER — Other Ambulatory Visit: Payer: Self-pay

## 2020-12-31 VITALS — BP 140/70 | HR 73 | Temp 97.8°F | Ht 72.0 in | Wt 190.7 lb

## 2020-12-31 DIAGNOSIS — Z0000 Encounter for general adult medical examination without abnormal findings: Secondary | ICD-10-CM | POA: Diagnosis not present

## 2020-12-31 DIAGNOSIS — Z23 Encounter for immunization: Secondary | ICD-10-CM

## 2020-12-31 LAB — HEPATIC FUNCTION PANEL
ALT: 21 U/L (ref 0–53)
AST: 29 U/L (ref 0–37)
Albumin: 4.7 g/dL (ref 3.5–5.2)
Alkaline Phosphatase: 70 U/L (ref 39–117)
Bilirubin, Direct: 0.1 mg/dL (ref 0.0–0.3)
Total Bilirubin: 0.9 mg/dL (ref 0.2–1.2)
Total Protein: 6.5 g/dL (ref 6.0–8.3)

## 2020-12-31 LAB — BASIC METABOLIC PANEL
BUN: 20 mg/dL (ref 6–23)
CO2: 27 mEq/L (ref 19–32)
Calcium: 9.7 mg/dL (ref 8.4–10.5)
Chloride: 104 mEq/L (ref 96–112)
Creatinine, Ser: 1.08 mg/dL (ref 0.40–1.50)
GFR: 71.81 mL/min (ref >60.00)
Glucose, Bld: 90 mg/dL (ref 70–99)
Potassium: 4.1 mEq/L (ref 3.5–5.1)
Sodium: 141 mEq/L (ref 135–145)

## 2020-12-31 LAB — CBC WITH DIFFERENTIAL/PLATELET
Basophils Absolute: 0 10*3/uL (ref 0.0–0.1)
Basophils Relative: 0.1 % (ref 0.0–3.0)
Eosinophils Absolute: 0.3 10*3/uL (ref 0.0–0.7)
Eosinophils Relative: 3.9 % (ref 0.0–5.0)
HCT: 45.3 % (ref 39.0–52.0)
Hemoglobin: 15.9 g/dL (ref 13.0–17.0)
Lymphocytes Relative: 20.2 % (ref 12.0–46.0)
Lymphs Abs: 1.4 10*3/uL (ref 0.7–4.0)
MCHC: 35.2 g/dL (ref 30.0–36.0)
MCV: 97.5 fl (ref 78.0–100.0)
Monocytes Absolute: 0.6 10*3/uL (ref 0.1–1.0)
Monocytes Relative: 8.3 % (ref 3.0–12.0)
Neutro Abs: 4.7 10*3/uL (ref 1.4–7.7)
Neutrophils Relative %: 67.5 % (ref 43.0–77.0)
Platelets: 171 10*3/uL (ref 150.0–400.0)
RBC: 4.65 Mil/uL (ref 4.22–5.81)
RDW: 12.9 % (ref 11.5–15.5)
WBC: 7 10*3/uL (ref 4.0–10.5)

## 2020-12-31 LAB — LIPID PANEL
Cholesterol: 229 mg/dL — ABNORMAL HIGH (ref 0–200)
HDL: 50.4 mg/dL (ref >39.00)
LDL Cholesterol: 140 mg/dL — ABNORMAL HIGH (ref 0–99)
NonHDL: 179.07
Total CHOL/HDL Ratio: 5
Triglycerides: 193 mg/dL — ABNORMAL HIGH (ref 0.0–149.0)
VLDL: 38.6 mg/dL (ref 0.0–40.0)

## 2020-12-31 LAB — TSH: TSH: 2.98 u[IU]/mL (ref 0.35–4.50)

## 2020-12-31 LAB — PSA: PSA: 1.77 ng/mL (ref 0.10–4.00)

## 2020-12-31 MED ORDER — SILDENAFIL CITRATE 20 MG PO TABS: ORAL_TABLET | ORAL | 3 refills | Status: DC

## 2020-12-31 NOTE — Progress Notes (Addendum)
Established Patient Office Visit  Subjective:  Patient ID: Edwin SarnaStuart J Trott, male    DOB: 08-24-1955  Age: 65 y.o. MRN: [id number]  CC:  Chief Complaint  Patient presents with   Annual Exam    Knott on mid back, no discomfort, x 3 years, roughly the size of a dime.     HPI Edwin SarnaStuart J Townsend presents for physical exam.  He was diagnosed in the past year with atrial fibrillation and has been seen through the atrial fibrillation clinic with cardiology.  He had CHA2DS2-VASc score of 1 and so is not taking any anticoagulants.  He has metoprolol to take for increased rate but generally rate has been well controlled.  He still exercising regularly.  He decided not to pursue ablation or medication options otherwise.  His oldest daughter who is a Counsellorclinical psychologist have been living [place] and will be moving to the [place] area and he excited about that.  Health maintenance reviewed:  -Needs pneumonia vaccine.  He plans to get Shingrix today and he elects to hold on pneumonia shot at this time until later date -First Shingrix given today and will need second in 2 to 6 months -Previous hepatitis C screen negative -Tetanus due 2024 -Colonoscopy due 2027 -COVID vaccines complete  Family history-Father died at age 66.  He had dementia.  His mom is also been diagnosed with dementia age 66 currently.  She has history of atrial fibrillation.  He has a brother with history of atrial fibrillation.  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  Non-smoker.  Exercises regularly.  Has 2 daughters.  1 daughter is a Runner, broadcasting/film/videoteacher in [place] and oldest daughter is married and is Counsellorclinical psychologist and [place].  The 10-year ASCVD risk score Denman George(Goff DC Montez HagemanJr., et al., 2013) is: 16.1%   Values used to calculate the score:     Age: [age] years     Sex: Male     Is Non-Hispanic African American: No     Diabetic: No     Tobacco smoker: No      Systolic Blood Pressure: 140 mmHg     Is BP treated: No     HDL Cholesterol: 50.4 mg/dL     Total Cholesterol: 229 mg/dL    Past Medical History:  Diagnosis Date   Allergy    Arthritis    Family history of adverse reaction to anesthesia    pts father had back surgery 10 years ago developed dementia    Nonobstructive atherosclerosis of coronary artery 2016   cardiac CT   Sinus infection     Past Surgical History:  Procedure Laterality Date   KNEE SURGERY  2009   microfracture surgery; left    OTHER SURGICAL HISTORY  12/02/2016   sinus    ROOT CANAL     TONSILLECTOMY     TOTAL HIP ARTHROPLASTY Right 12/02/2015   Procedure: RIGHT TOTAL HIP ARTHROPLASTY ANTERIOR APPROACH;  Surgeon: Ollen GrossFrank Aluisio, MD;  Location: [facility];  Service: Orthopedics;  Laterality: Right;    Family History  Problem Relation Age of Onset   Dementia Mother    Atrial fibrillation Mother 4370   Hyperlipidemia Father    Dementia Father    Multiple sclerosis Brother    CAD Brother 5356       3 stents   Atrial fibrillation Brother 6656   CAD Brother        70%  stenosis in on coronary   CAD Paternal Uncle 99       Died of MI    Social History   Socioeconomic History   Marital status: Married    Spouse name: Not on file   Number of children: 2   Years of education: Not on file   Highest education level: Not on file  Occupational History   Occupation: Professor of Therapeutic Recreation  Tobacco Use   Smoking status: Former    Packs/day: 1.00    Years: 5.00    Pack years: 5.00    Types: Cigarettes    Quit date: 10/01/1977    Years since quitting: 43.2   Smokeless tobacco: Never  Vaping Use   Vaping Use: Never used  Substance and Sexual Activity   Alcohol use: Yes    Alcohol/week: 4.0 standard drinks    Types: 4 Standard drinks or equivalent per week    Comment: beer socially (2 beers per day on average)   Drug use: No   Sexual activity: Not on file  Other Topics Concern   Not on file   Social History Narrative   Lives in [place] with spouse.  Grown daughters (2) healthy   Works as a Airline pilot at [employer],  Licensed conveyancer)   Social Determinants of Corporate investment banker Strain: Not on [employer] Insecurity: Not on file  Transportation Needs: Not on file  Physical Activity: Not on file  Stress: Not on file  Social Connections: Not on file  Intimate Partner Violence: Not on file    Outpatient Medications Prior to Visit  Medication Sig Dispense Refill   budesonide (PULMICORT) 0.5 MG/2ML nebulizer solution Take 0.5 mg by nebulization as needed. Use as directed     EPINEPHrine 0.3 mg/0.3 mL IJ SOAJ injection Inject into the muscle.     fluticasone (FLONASE) 50 MCG/ACT nasal spray SHAKE LIQUID AND USE 1 SPRAY IN EACH NOSTRIL AT BEDTIME 16 g 11   loratadine (CLARITIN) 10 MG tablet Take 10 mg by mouth every other day.     metoprolol tartrate (LOPRESSOR) 50 MG tablet Take one half tablet every 12 hours as needed for palpitations/elevated heart rate 30 tablet 1   Multiple Vitamin (MULTIVITAMIN) tablet Take 1 tablet by mouth daily.     sildenafil (REVATIO) 20 MG tablet TAKE 2 TO 5 TABLETS BY MOUTH ONE HOUR PRIOR TO SEXUAL ACTIVITY AS NEEDED 50 tablet 3   No facility-administered medications prior to visit.    Allergies  Allergen Reactions   Molds & Smuts Shortness Of Breath and Other (See Comments)   Quercus Robur Shortness Of Breath   [place] [employer] [place] Other (See Comments)    ROS Review of Systems  Constitutional:  Negative for activity change, appetite change, fatigue and fever.  HENT:  Negative for congestion, ear pain and trouble swallowing.   Eyes:  Negative for pain and visual disturbance.  Respiratory:  Negative for cough, shortness of breath and wheezing.   Cardiovascular:  Negative for chest pain and palpitations.  Gastrointestinal:  Negative for abdominal distention, abdominal pain, blood in stool, constipation,  diarrhea, nausea, rectal pain and vomiting.  Genitourinary:  Negative for dysuria, hematuria and testicular pain.  Musculoskeletal:  Negative for arthralgias and joint swelling.  Skin:  Negative for rash.  Neurological:  Negative for dizziness, syncope and headaches.  Hematological:  Negative for adenopathy.  Psychiatric/Behavioral:  Negative for confusion and dysphoric mood.      Objective:  Physical Exam Vitals reviewed.  Constitutional:      Appearance: Normal appearance.  HENT:     Right Ear: Tympanic membrane normal.     Left Ear: Tympanic membrane normal.     Mouth/Throat:     Mouth: Mucous membranes are moist.     Pharynx: Oropharynx is clear. No posterior oropharyngeal erythema.  Cardiovascular:     Comments: Irregular rhythm but rate controlled Pulmonary:     Effort: Pulmonary effort is normal.     Breath sounds: Normal breath sounds.  Abdominal:     Palpations: Abdomen is soft. There is no mass.     Tenderness: There is no abdominal tenderness. There is no guarding or rebound.  Musculoskeletal:     Cervical back: Neck supple.     Right lower leg: No edema.     Left lower leg: No edema.  Lymphadenopathy:     Cervical: No cervical adenopathy.  Skin:    Comments: Small cystic lesion left upper thoracic back just left of the spine.  Nontender.  No erythema.  No warmth.  Neurological:     General: No focal deficit present.     Mental Status: He is alert.    BP 140/70 (BP Location: Left Arm, Patient Position: Sitting, Cuff Size: Normal)   Pulse 73   Temp 97.8 F (36.6 C) (Oral)   Ht 6' (1.829 m)   Wt 190 lb 11.2 oz (86.5 kg)   SpO2 97%   BMI 25.86 kg/m  Wt Readings from Last 3 Encounters:  12/31/20 190 lb 11.2 oz (86.5 kg)  12/04/20 192 lb 3.2 oz (87.2 kg)  08/20/20 193 lb 3.2 oz (87.6 kg)     Health Maintenance Due  Topic Date Due   Zoster Vaccines- Shingrix (1 of 2) Never done   PNA vac Low Risk Adult (1 of 2 - PCV13) Never done    There are no  preventive care reminders to display for this patient.  Lab Results  Component Value Date   TSH 3.15 10/27/2019   Lab Results  Component Value Date   WBC 6.0 10/27/2019   HGB 16.2 10/27/2019   HCT 46.1 10/27/2019   MCV 99.4 10/27/2019   PLT 172.0 10/27/2019   Lab Results  Component Value Date   NA 141 10/27/2019   K 4.3 10/27/2019   CO2 30 10/27/2019   GLUCOSE 87 10/27/2019   BUN 13 10/27/2019   CREATININE 0.92 10/27/2019   BILITOT 1.0 10/27/2019   ALKPHOS 74 10/27/2019   AST 27 10/27/2019   ALT 20 10/27/2019   PROT 6.2 10/27/2019   ALBUMIN 4.5 10/27/2019   CALCIUM 9.3 10/27/2019   ANIONGAP 7 12/03/2015   GFR 82.63 10/27/2019   Lab Results  Component Value Date   CHOL 221 (H) 10/27/2019   Lab Results  Component Value Date   HDL 52.60 10/27/2019   Lab Results  Component Value Date   LDLCALC 149 (H) 10/27/2019   Lab Results  Component Value Date   TRIG 101.0 10/27/2019   Lab Results  Component Value Date   CHOLHDL 4 10/27/2019   No results found for: HGBA1C    Assessment & Plan:   Problem List Items Addressed This Visit   None Visit Diagnoses     Physical exam    -  Primary   Relevant Orders   Basic metabolic panel   Lipid panel   CBC with Differential/Platelet   TSH   Hepatic function panel   PSA  Patient has A. fib which is currently rate controlled.  He has elected conservative management at this time and is followed by cardiology.  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  -Continue annual flu vaccine -Shingrix given and needs booster in 2 to 6 months -Recommend Prevnar 20 and he wishes to get out at a later date.  We have recommended get this sometime with this year.  Patient preference was not to get the same date as his shingles vaccine even though we explained these are safe to get together -Obtain screening labs above including PSA -Continue regular exercise habits  Meds ordered this  encounter  Medications   sildenafil (REVATIO) 20 MG tablet    Sig: TAKE 2 TO 5 TABLETS BY MOUTH ONE HOUR PRIOR TO SEXUAL ACTIVITY AS NEEDED    Dispense:  50 tablet    Refill:  3    Generic For:*REVATIO 20MG     Follow-up: No follow-ups on file.    , MD

## 2020-12-31 NOTE — Patient Instructions (Signed)
Remember to get second shingles vaccine in 2 to 6 months  Set up pneumonia vaccine (Prevnar 20)

## 2021-03-11 ENCOUNTER — Telehealth: Payer: Self-pay

## 2021-03-11 DIAGNOSIS — Z23 Encounter for immunization: Secondary | ICD-10-CM

## 2021-03-11 NOTE — Telephone Encounter (Signed)
Patient called to cancel 2nd shingrix vaccine and would like to get 2 vaccine at [facility] pt wants to know if Rx can be sent to pharmacy and a call back when sent.

## 2021-03-12 ENCOUNTER — Telehealth: Payer: Self-pay | Admitting: Family Medicine

## 2021-03-12 NOTE — Telephone Encounter (Signed)
Rx sent in for vaccine. Called and spoke with the patients wife. She is aware this has been sent in.

## 2021-03-12 NOTE — Telephone Encounter (Signed)
Spoke with the patients wife this morning. Please see the other phone encounter for further documentation.

## 2021-03-12 NOTE — Addendum Note (Signed)
Addended by: Solon Augusta on: 03/12/2021 08:52 AM   Modules accepted: Orders

## 2021-03-12 NOTE — Telephone Encounter (Signed)
Patient is requesting a call back from his nurse about his Shingles injection through a MyChart message.

## 2021-04-02 ENCOUNTER — Ambulatory Visit: Payer: BC Managed Care – PPO

## 2021-04-16 ENCOUNTER — Ambulatory Visit (HOSPITAL_BASED_OUTPATIENT_CLINIC_OR_DEPARTMENT_OTHER): Payer: Medicare PPO | Admitting: Internal Medicine

## 2021-04-16 ENCOUNTER — Other Ambulatory Visit: Payer: Self-pay

## 2021-04-16 VITALS — BP 126/74 | HR 61 | Ht 72.0 in | Wt 184.6 lb

## 2021-04-16 DIAGNOSIS — I2510 Atherosclerotic heart disease of native coronary artery without angina pectoris: Secondary | ICD-10-CM | POA: Diagnosis not present

## 2021-04-16 DIAGNOSIS — I480 Paroxysmal atrial fibrillation: Secondary | ICD-10-CM

## 2021-04-16 MED ORDER — METOPROLOL TARTRATE 50 MG PO TABS: ORAL_TABLET | ORAL | 1 refills | Status: DC

## 2021-04-16 NOTE — Patient Instructions (Signed)
Medication Instructions:  Your physician recommends that you continue on your current medications as directed. Please refer to the Current Medication list given to you today. *If you need a refill on your cardiac medications before your next appointment, please call your pharmacy*  Lab Work: None. If you have labs (blood work) drawn today and your tests are completely normal, you will receive your results only by: MyChart Message (if you have MyChart) OR A paper copy in the mail If you have any lab test that is abnormal or we need to change your treatment, we will call you to review the results.  Testing/Procedures: None.  Follow-Up: At [facility], you and your health needs are our priority.  As part of our continuing mission to provide you with exceptional heart care, we have created designated Provider Care Teams.  These Care Teams include your primary Cardiologist (physician) and Advanced Practice Providers (APPs -  Physician Assistants and Nurse Practitioners) who all work together to provide you with the care you need, when you need it.  Your physician wants you to follow-up in: 2 months with  Edwin Range, MD   We recommend signing up for the patient portal called "MyChart".  Sign up information is provided on this After Visit Summary.  MyChart is used to connect with patients for Virtual Visits (Telemedicine).  Patients are able to view lab/test results, encounter notes, upcoming appointments, etc.  Non-urgent messages can be sent to your provider as well.   To learn more about what you can do with MyChart, go to [email or web address].    Any Other Special Instructions Will Be Listed Below (If Applicable).

## 2021-04-16 NOTE — Progress Notes (Signed)
PCP: Kristian Covey, MD   Primary EP: Dr Evonnie Pat is a 65 y.o. male who presents today for routine electrophysiology followup.  Since last being seen in our clinic, the patient reports doing reasonably well.  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  This was fortunately benign.  He is making slow recovery. He has had increasing frequency and duration of his afib.  + palpitations.   He seems to have episodes almost weekly now. Today, he denies symptoms of  chest pain, shortness of breath,  lower extremity edema, dizziness, presyncope, or syncope.  The patient is otherwise without complaint today.   Past Medical History:  Diagnosis Date   Allergy    Arthritis    Family history of adverse reaction to anesthesia    pts father had back surgery 10 years ago developed dementia    Nonobstructive atherosclerosis of coronary artery 2016   cardiac CT   Sinus infection    Past Surgical History:  Procedure Laterality Date   KNEE SURGERY  2009   microfracture surgery; left    OTHER SURGICAL HISTORY  12/02/2016   sinus    ROOT CANAL     TONSILLECTOMY     TOTAL HIP ARTHROPLASTY Right 12/02/2015   Procedure: RIGHT TOTAL HIP ARTHROPLASTY ANTERIOR APPROACH;  Surgeon: Ollen Gross, MD;  Location: [facility];  Service: Orthopedics;  Laterality: Right;    ROS- all systems are reviewed and negatives except as per HPI above  Current Outpatient Medications  Medication Sig Dispense Refill   budesonide (PULMICORT) 0.5 MG/2ML nebulizer solution Take 0.5 mg by nebulization as needed. Use as directed     fluticasone (FLONASE) 50 MCG/ACT nasal spray SHAKE LIQUID AND USE 1 SPRAY IN EACH NOSTRIL AT BEDTIME 16 g 11   loratadine (CLARITIN) 10 MG tablet Take 10 mg by mouth every other day.     metoprolol tartrate (LOPRESSOR) 50 MG tablet Take one half tablet every 12 hours as needed for palpitations/elevated heart rate 30 tablet 1   Multiple Vitamin  (MULTIVITAMIN) tablet Take 1 tablet by mouth daily.     sildenafil (REVATIO) 20 MG tablet TAKE 2 TO 5 TABLETS BY MOUTH ONE HOUR PRIOR TO SEXUAL ACTIVITY AS NEEDED 50 tablet 3   EPINEPHrine 0.3 mg/0.3 mL IJ SOAJ injection Inject into the muscle.     No current facility-administered medications for this visit.    Physical Exam: Vitals:   04/16/21 1455  BP: 126/74  Pulse: 61  SpO2: 96%  Weight: 184 lb 9.6 oz (83.7 kg)  Height: 6' (1.829 m)    GEN- The patient is well appearing, alert and oriented x 3 today.   Head- normocephalic, atraumatic Eyes-  Sclera clear, conjunctiva pink Ears- hearing intact Oropharynx- clear Lungs- Clear to ausculation bilaterally, normal work of breathing Heart- Regular rate and rhythm, no murmurs, rubs or gallops, PMI not laterally displaced GI- soft, NT, ND, + BS Extremities- no clubbing, cyanosis, or edema  Wt Readings from Last 3 Encounters:  04/16/21 184 lb 9.6 oz (83.7 kg)  12/31/20 190 lb 11.2 oz (86.5 kg)  12/04/20 192 lb 3.2 oz (87.2 kg)    EKG tracing ordered today is personally reviewed and shows sinus rhythm  Assessment and Plan:  Paroxysmal atrial fibrillation/ disorganized atiral activity Chads2vasc score is 1.  He wishes to defer OAC.   We discussed afib at length.  I have offered both flecainide and ablation.  At this time, he would prefer to see how he further recovers from his recent neuro surgery. We will have him return in 2 months to reassess.     Hillis Range MD, [facility] 04/16/2021 2:57 PM

## 2021-05-23 ENCOUNTER — Other Ambulatory Visit (HOSPITAL_BASED_OUTPATIENT_CLINIC_OR_DEPARTMENT_OTHER): Payer: Self-pay | Admitting: Internal Medicine

## 2021-05-23 NOTE — Telephone Encounter (Signed)
This is a [facility] pt, that was seen there by Dr. Johney Frame, please address. Thanks

## 2021-06-27 ENCOUNTER — Ambulatory Visit (HOSPITAL_BASED_OUTPATIENT_CLINIC_OR_DEPARTMENT_OTHER): Payer: Medicare PPO | Admitting: Internal Medicine

## 2021-06-27 ENCOUNTER — Encounter (HOSPITAL_BASED_OUTPATIENT_CLINIC_OR_DEPARTMENT_OTHER): Payer: Self-pay | Admitting: Internal Medicine

## 2021-06-27 ENCOUNTER — Other Ambulatory Visit: Payer: Self-pay

## 2021-06-27 VITALS — BP 124/80 | HR 150 | Ht 72.0 in | Wt 195.0 lb

## 2021-06-27 DIAGNOSIS — I2510 Atherosclerotic heart disease of native coronary artery without angina pectoris: Secondary | ICD-10-CM

## 2021-06-27 DIAGNOSIS — I480 Paroxysmal atrial fibrillation: Secondary | ICD-10-CM | POA: Diagnosis not present

## 2021-06-27 NOTE — Patient Instructions (Addendum)
Medication Instructions:  Your physician recommends that you continue on your current medications as directed. Please refer to the Current Medication list given to you today. *If you need a refill on your cardiac medications before your next appointment, please call your pharmacy*  Lab Work: None. If you have labs (blood work) drawn today and your tests are completely normal, you will receive your results only by: MyChart Message (if you have MyChart) OR A paper copy in the mail If you have any lab test that is abnormal or we need to change your treatment, we will call you to review the results.  Testing/Procedures: None.  Follow-Up: At [facility], you and your health needs are our priority.  As part of our continuing mission to provide you with exceptional heart care, we have created designated Provider Care Teams.  These Care Teams include your primary Cardiologist (physician) and Advanced Practice Providers (APPs -  Physician Assistants and Nurse Practitioners) who all work together to provide you with the care you need, when you need it.  Your physician wants you to follow-up in: 2 months with the Afib Clinic. They will contact you to schedule. Call [address] office at [phone] if you decide you want an ablation. Afib Clinic [phone]  We recommend signing up for the patient portal called "MyChart".  Sign up information is provided on this After Visit Summary.  MyChart is used to connect with patients for Virtual Visits (Telemedicine).  Patients are able to view lab/test results, encounter notes, upcoming appointments, etc.  Non-urgent messages can be sent to your provider as well.   To learn more about what you can do with MyChart, go to [email or web address].    Any Other Special Instructions Will Be Listed Below (If Applicable).

## 2021-06-27 NOTE — Progress Notes (Signed)
PCP: Eulas Post, MD   Primary EP: Dr Larey Brick is a 65 y.o. male who presents today for routine electrophysiology followup.  Since last being seen in our clinic, the patient reports doing very well.  He has rare episodes of afib.  He is in afib today.  He feels quite confident that the episode started this am and will quickly resolve.  Today, he denies symptoms of chest pain, shortness of breath,  lower extremity edema, dizziness, presyncope, or syncope.  The patient is otherwise without complaint today.   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 Use as directed     EPINEPHrine 0.3 mg/0.3 mL IJ SOAJ injection Inject into the muscle.     fluticasone (FLONASE) 50 MCG/ACT nasal spray SHAKE LIQUID AND USE 1 SPRAY IN EACH NOSTRIL AT BEDTIME 16 g 11   loratadine (CLARITIN) 10 MG tablet Take 10 mg by mouth every other day.     metoprolol tartrate (LOPRESSOR) 50 MG tablet TAKE 1/2 TABLET BY MOUTH EVERY 12 HOURS AS NEEDED FOR PALPITATIONS OR ELEVATED HEART RATE 90 tablet 0   Multiple Vitamin (MULTIVITAMIN) tablet Take 1 tablet by mouth daily.      sildenafil (REVATIO) 20 MG tablet TAKE 2 TO 5 TABLETS BY MOUTH ONE HOUR PRIOR TO SEXUAL ACTIVITY AS NEEDED 50 tablet 3   No current facility-administered medications for this visit.    Physical Exam: Vitals:   06/27/21 1158  BP: 124/80  Pulse: (!) 150  SpO2: 97%  Weight: 195 lb (88.5 kg)  Height: 6' (1.829 m)    GEN- The patient is well appearing, alert and oriented x 3 today.   Head- normocephalic, atraumatic Eyes-  Sclera clear, conjunctiva pink Ears- hearing intact Oropharynx- clear Lungs- Clear to ausculation bilaterally, normal work of breathing Heart- tachycardic irregular rhythm GI- soft, NT, ND, + BS Extremities- no clubbing, cyanosis, or edema  Wt Readings from Last 3 Encounters:  06/27/21 195 lb (88.5 kg)  04/16/21 184 lb 9.6 oz (83.7 kg)  12/31/20 190 lb 11.2 oz (86.5 kg)    EKG tracing ordered today is personally reviewed and shows afib with RVR,  V rates 150s  Assessment and Plan:  Paroxysmal atrial fibrillation/ disorganized atrial activity Chads2vasc score is 1.  He declines [place] therapy today. The patient has symptomatic, recurrent paroxysmal atrial fibrillation. he has not tried AAD therapy and is reluctant to do so. I have advised that he take metoprolol when he gets home today and to contact the AF clinic if his AF does not  resolve today. Therapeutic strategies for afib including medicine (flecainide) and ablation were discussed in detail with the patient today. Risk, benefits, and alternatives to EP study and radiofrequency ablation for afib were also discussed in detail today. These risks include but are not limited to stroke, bleeding, vascular damage, tamponade, perforation, damage to the esophagus, lungs, and other structures, pulmonary vein stenosis, worsening renal function, and death. The patient understands these risk and wishes to think about this further.  He will contact my office if he decides to proceed. He would need to start xarelto 20mg   daily x at least 3 weeks prior to ablation.  If he decides to not proceed with ablation at this time, we will have him followup in the AF clinic in 2 months   MD, [facility] 06/27/2021 12:00 PM

## 2021-07-02 ENCOUNTER — Telehealth: Payer: Self-pay | Admitting: Internal Medicine

## 2021-07-02 NOTE — Telephone Encounter (Signed)
Pt would like to speak w/ the nurse in regards to procedure.. please advise

## 2021-07-02 NOTE — Telephone Encounter (Signed)
The patient stated that Dr. Johney Frame recommended moving forward with ablation procedure on 06/27/21 and he went home to think about it. He does wish to move forward with the procedure and wishes for Dr. Johney Frame to do this if he can, he also understands the change coming up for Dr. Jenel Lucks schedule at the beginning of the new year but still wishes to have him perform the procedure if able. Advised the patient that I would forward his request to Dr. Johney Frame and also will give him two EP providers names over mychart that do this type of procedure as well, if Dr. Johney Frame is not able to get cath lab time after the new year so he can look at his other options.

## 2021-07-04 ENCOUNTER — Encounter (HOSPITAL_BASED_OUTPATIENT_CLINIC_OR_DEPARTMENT_OTHER): Payer: Self-pay | Admitting: Internal Medicine

## 2021-07-04 NOTE — Telephone Encounter (Signed)
Got routed to [place] by mistake please advise!

## 2021-07-08 NOTE — Telephone Encounter (Signed)
Please schedule ablation with either Dr Elberta Fortis or Lalla Brothers.  Schedule office visit with ablation performing EP, 2-3 weeks prior to the procedure.

## 2022-01-02 ENCOUNTER — Encounter: Payer: Self-pay | Admitting: Family Medicine

## 2022-01-02 ENCOUNTER — Ambulatory Visit (INDEPENDENT_AMBULATORY_CARE_PROVIDER_SITE_OTHER): Payer: Medicare PPO | Admitting: Family Medicine

## 2022-01-02 VITALS — BP 126/80 | HR 60 | Temp 97.4°F | Ht 69.69 in | Wt 188.4 lb

## 2022-01-02 DIAGNOSIS — Z125 Encounter for screening for malignant neoplasm of prostate: Secondary | ICD-10-CM

## 2022-01-02 DIAGNOSIS — Z0000 Encounter for general adult medical examination without abnormal findings: Secondary | ICD-10-CM

## 2022-01-02 LAB — BASIC METABOLIC PANEL
BUN: 15 mg/dL (ref 6–23)
CO2: 31 mEq/L (ref 19–32)
Calcium: 9.4 mg/dL (ref 8.4–10.5)
Chloride: 104 mEq/L (ref 96–112)
Creatinine, Ser: 1.05 mg/dL (ref 0.40–1.50)
GFR: 73.75 mL/min (ref >60.00)
Glucose, Bld: 89 mg/dL (ref 70–99)
Potassium: 4.4 mEq/L (ref 3.5–5.1)
Sodium: 141 mEq/L (ref 135–145)

## 2022-01-02 LAB — HEPATIC FUNCTION PANEL
ALT: 21 U/L (ref 0–53)
AST: 28 U/L (ref 0–37)
Albumin: 4.4 g/dL (ref 3.5–5.2)
Alkaline Phosphatase: 74 U/L (ref 39–117)
Bilirubin, Direct: 0.2 mg/dL (ref 0.0–0.3)
Total Bilirubin: 1.1 mg/dL (ref 0.2–1.2)
Total Protein: 6.3 g/dL (ref 6.0–8.3)

## 2022-01-02 LAB — CBC WITH DIFFERENTIAL/PLATELET
Basophils Absolute: 0 10*3/uL (ref 0.0–0.1)
Basophils Relative: 0.2 % (ref 0.0–3.0)
Eosinophils Absolute: 0.3 10*3/uL (ref 0.0–0.7)
Eosinophils Relative: 4.1 % (ref 0.0–5.0)
HCT: 45.9 % (ref 39.0–52.0)
Hemoglobin: 15.9 g/dL (ref 13.0–17.0)
Lymphocytes Relative: 21.6 % (ref 12.0–46.0)
Lymphs Abs: 1.4 10*3/uL (ref 0.7–4.0)
MCHC: 34.6 g/dL (ref 30.0–36.0)
MCV: 99.3 fl (ref 78.0–100.0)
Monocytes Absolute: 0.4 10*3/uL (ref 0.1–1.0)
Monocytes Relative: 7.1 % (ref 3.0–12.0)
Neutro Abs: 4.3 10*3/uL (ref 1.4–7.7)
Neutrophils Relative %: 67 % (ref 43.0–77.0)
Platelets: 168 10*3/uL (ref 150.0–400.0)
RBC: 4.63 Mil/uL (ref 4.22–5.81)
RDW: 13 % (ref 11.5–15.5)
WBC: 6.4 10*3/uL (ref 4.0–10.5)

## 2022-01-02 LAB — LIPID PANEL
Cholesterol: 215 mg/dL — ABNORMAL HIGH (ref 0–200)
HDL: 44.6 mg/dL (ref >39.00)
NonHDL: 170.54
Total CHOL/HDL Ratio: 5
Triglycerides: 242 mg/dL — ABNORMAL HIGH (ref 0.0–149.0)
VLDL: 48.4 mg/dL — ABNORMAL HIGH (ref 0.0–40.0)

## 2022-01-02 LAB — LDL CHOLESTEROL, DIRECT: Direct LDL: 121 mg/dL

## 2022-01-02 LAB — PSA: PSA: 1.77 ng/mL (ref 0.10–4.00)

## 2022-01-02 LAB — TSH: TSH: 3.51 u[IU]/mL (ref 0.35–5.50)

## 2022-02-25 DIAGNOSIS — H5203 Hypermetropia, bilateral: Secondary | ICD-10-CM | POA: Diagnosis not present

## 2022-02-25 DIAGNOSIS — D23111 Other benign neoplasm of skin of right upper eyelid, including canthus: Secondary | ICD-10-CM | POA: Diagnosis not present

## 2022-02-25 DIAGNOSIS — H52203 Unspecified astigmatism, bilateral: Secondary | ICD-10-CM | POA: Diagnosis not present

## 2022-03-09 ENCOUNTER — Ambulatory Visit: Payer: Medicare PPO | Admitting: Family Medicine

## 2022-03-09 ENCOUNTER — Encounter: Payer: Self-pay | Admitting: Family Medicine

## 2022-03-09 ENCOUNTER — Telehealth: Payer: Self-pay

## 2022-03-09 VITALS — BP 108/60 | HR 63 | Temp 98.1°F | Ht 69.6 in | Wt 186.3 lb

## 2022-03-09 DIAGNOSIS — R051 Acute cough: Secondary | ICD-10-CM

## 2022-03-09 DIAGNOSIS — U071 COVID-19: Secondary | ICD-10-CM | POA: Diagnosis not present

## 2022-03-09 MED ORDER — BENZONATATE 100 MG PO CAPS: 100.0000 mg | ORAL_CAPSULE | Freq: Three times a day (TID) | ORAL | 0 refills | Status: DC | PRN

## 2022-03-09 NOTE — Progress Notes (Signed)
Established Patient Office Visit  Subjective   Patient ID: Edwin [name], male    DOB: [birth year]  Age: 66 y.o. MRN: [id number]  Chief Complaint  Patient presents with   Cough    Patient complains of cough, x11 days, Productive cough     HPI   Cough with onset about 11 days ago.  [place].  He initially had some greenish sputum production and had 5 days of leftover amoxicillin which he took which promptly cleared his green sputum production.  No fever.  He is actually feeling better now but when he got home he was suggested that he take a COVID test because of his persistent cough and on Saturday this came back positive.  He had minimal nasal congestion.  Initially very mild sore throat.  No documented fever at this time but possibly low-grade fever initially.  Overall much improved though he still has frequent dry cough.  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 His smoking use included cigarettes. He has a 5.00 pack-year smoking history. He has never used smokeless tobacco. He reports current alcohol use of about 4.0 standard drinks of alcohol per week. He reports that he does not use drugs. family history includes Atrial fibrillation (age of onset: 69) in his  brother; Atrial fibrillation (age of onset: 50) in his mother; CAD in his brother; CAD (age of onset: 27) in his paternal uncle; CAD (age of onset: 58) in his brother; Dementia in his father and mother; Hyperlipidemia in his father; Multiple sclerosis in his brother. Allergies  Allergen Reactions   Molds & Smuts Shortness Of Breath and Other (See Comments)   Quercus Robur Shortness Of Breath   [employer] Other (See Comments)    Review of Systems  Constitutional:  Negative for chills and fever.  HENT:  Negative for sore throat.   Respiratory:  Positive for cough. Negative for hemoptysis, shortness of breath and wheezing.       Objective:     BP 108/60 (BP Location: Left Arm, Patient Position: Sitting, Cuff Size: Normal)   Pulse 63   Temp 98.1 F (36.7 C) (Oral)   Ht 5' 9.6" (1.768 m)   Wt 186 lb 4.8 oz (84.5 kg)   SpO2 100%   BMI 27.04 kg/m    Physical Exam Vitals reviewed.  Constitutional:      General: He is not in acute distress.    Appearance: Normal appearance. He is not ill-appearing.  Cardiovascular:     Rate and Rhythm: Normal rate and regular rhythm.  Pulmonary:     Effort: Pulmonary effort is normal. No respiratory distress.  Breath sounds: Normal breath sounds. No wheezing or rales.  Neurological:     Mental Status: He is alert.      No results found for any visits on 03/09/22.    The 10-year ASCVD risk score (Arnett DK, et al., 2019) is: 12.3%    Assessment & Plan:   COVID by home testing couple days ago with some persistent cough.  His onset of symptoms was 11 days ago.  Patient in no respiratory distress. -Offered Tessalon Perles 100 mg every 8 hours as needed for cough -Should be low risk of infectivity at this point -Follow-up promptly for any fever, increased shortness of breath, or other concerns  No follow-ups on file.    Evelena Peat, MD

## 2022-03-09 NOTE — Telephone Encounter (Signed)
--  Caller states he first noticed s/s friday over 1 week ago. Today he tested positive for COVID. He has a productive cough, body aches, and slightly fatigued. Denies fever. States his cough is his issue at this point  03/07/2022 4:04:59 PM Call PCP within 24 Hour Dutch John, RN, Archie Patten  Comments User: Juliette Alcide, RN Date/Time Lamount [name] Time): 03/07/2022 4:03:08 PM Pt mentioned cough medication/suppressant [facility] [address] in [place] ph# [phone] NKDA  User: Juliette Alcide, RN Date/Time Lamount [name] Time): 03/07/2022 4:05:50 PM Caller is wanting to cancel his appt for 1pm on Monday August 28th  Gweneth Dimitri- MD -Primary (from [facility] 8/26/20234:32:15PM Recommend over the counter cough suppressant or honey (if not diabetic). Can call on Monday to request prescription cough aid or try virtual visit through [facility] on the weekend Caller verbalized understanding  03/09/22 1117 - Pt states he symptoms started 11 days ago, however, he only tested on 03/07/22 prior to his vacation. Pt has appt with PCP at 1:15. Pt aware that he must wear a mask.

## 2022-03-30 ENCOUNTER — Ambulatory Visit (INDEPENDENT_AMBULATORY_CARE_PROVIDER_SITE_OTHER): Payer: Medicare PPO

## 2022-03-30 VITALS — Ht 71.0 in | Wt 186.0 lb

## 2022-03-30 DIAGNOSIS — Z0000 Encounter for general adult medical examination without abnormal findings: Secondary | ICD-10-CM | POA: Diagnosis not present

## 2022-03-30 NOTE — Progress Notes (Signed)
Subjective:   Edwin [name] is a 66 y.o. male who presents for Medicare Annual/Subsequent preventive examination.  Review of Systems    Virtual Visit via Telephone Note  I connected with  Edwin [name] on 03/30/22 at 10:30 AM EDT by telephone and verified that I am speaking with the correct person using two identifiers.  Location: Patient: Home Provider: Office Persons participating in the virtual visit: patient/Nurse Health Advisor   I discussed the limitations, risks, security and privacy concerns of performing an evaluation and management service by telephone and the availability of in person appointments. The patient expressed understanding and agreed to proceed.  Interactive audio and video telecommunications were attempted between this nurse and patient, however failed, due to patient having technical difficulties OR patient did not have access to video capability.  We continued and completed visit with audio only.  Some vital signs may be absent or patient reported.   Tillie Rung, LPN  Cardiac Risk Factors include: advanced age (>45men, >33 women);male gender;Other (see comment), Risk factor comments: Dx Afib     Objective:    Today's Vitals   03/30/22 1043  Weight: 186 lb (84.4 kg)  Height: 5\' 11"  (1.803 m)   Body mass index is 25.94 kg/m.     03/30/2022   10:52 AM 12/02/2015   11:17 AM 11/20/2015    1:38 PM 12/24/2014    2:13 PM 10/09/2014    2:03 PM  Advanced Directives  Does Patient Have a Medical Advance Directive? Yes Yes No No No  Type of 10/11/2014 of [place];Living will Healthcare Power of [place];Living will Healthcare Power of [place];Living will    Does patient want to make changes to medical advance directive?   No - Patient declined    Copy of Healthcare Power of Attorney in Chart? No - copy requested No - copy requested No - copy requested    Would patient like information on creating a medical advance  directive?    No - patient declined information No - patient declined information    Current Medications (verified) Outpatient Encounter Medications as of 03/30/2022  Medication Sig   benzonatate (TESSALON PERLES) 100 MG capsule Take 1 capsule (100 mg total) by mouth 3 (three) times daily as needed for cough.   budesonide (PULMICORT) 0.5 MG/2ML nebulizer solution Take 0.5 mg by nebulization as needed. Use as directed   fluticasone (FLONASE) 50 MCG/ACT nasal spray SHAKE LIQUID AND USE 1 SPRAY IN EACH NOSTRIL AT BEDTIME   loratadine (CLARITIN) 10 MG tablet Take 10 mg by mouth every other day.   metoprolol tartrate (LOPRESSOR) 50 MG tablet TAKE 1/2 TABLET BY MOUTH EVERY 12 HOURS AS NEEDED FOR PALPITATIONS OR ELEVATED HEART RATE   Multiple Vitamin (MULTIVITAMIN) tablet Take 1 tablet by mouth daily.   sildenafil (REVATIO) 20 MG tablet TAKE 2 TO 5 TABLETS BY MOUTH ONE HOUR PRIOR TO SEXUAL ACTIVITY AS NEEDED   No facility-administered encounter medications on file as of 03/30/2022.    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   Procedure: RIGHT TOTAL HIP ARTHROPLASTY ANTERIOR APPROACH;  Surgeon: Ollen Gross, MD;  Location: [facility];  Service: Orthopedics;  Laterality: Right;   Family History  Problem Relation Age of Onset   Dementia Mother    Atrial fibrillation Mother 78   Hyperlipidemia Father    Dementia Father    Multiple sclerosis Brother    CAD  Brother 1       3 stents   Atrial fibrillation Brother 68   CAD Brother        70% stenosis in on coronary   CAD Paternal Uncle 52       Died of MI   Social History   Socioeconomic History   Marital status: Married    Spouse name: Not on file   Number of children: 2   Years of education: Not on file   Highest education level: Doctorate  Occupational History   Occupation: Professor of Manufacturing systems engineer  Tobacco Use   Smoking status: Former    Packs/day: 1.00    Years: 5.00    Total pack years: 5.00    Types: Cigarettes    Quit date: 10/01/1977    Years since quitting: 44.5   Smokeless tobacco: Never  Vaping Use   Vaping Use: Never used  Substance and Sexual Activity   Alcohol use: Yes    Alcohol/week: 4.0 standard drinks of alcohol    Types: 4 Standard drinks or equivalent per week    Comment: beer socially (2 beers per day on average)   Drug use: No   Sexual activity: Not on file  Other Topics Concern   Not on file  Social History Narrative   Lives in [place] with spouse.  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[employer])   Social Determinants of Health   Financial Resource Strain: Low Risk  (03/30/2022)   Overall Financial Resource Strain (CARDIA)    Difficulty of Paying Living Expenses: Not hard at all  Food Insecurity: No Food Insecurity (03/30/2022)   Hunger Vital Sign    Worried About Running Out of Food in the Last Year: Never true    Ran Out of Food in the Last Year: Never true  Transportation Needs: No Transportation Needs (03/30/2022)   PRAPARE - Administrator, Civil Service (Medical): No    Lack of Transportation (Non-Medical): No  Physical Activity: Sufficiently Active (03/30/2022)   Exercise Vital Sign    Days of Exercise per Week: 5 days    Minutes of Exercise per Session: 60 min  Stress: No Stress Concern Present (03/30/2022)   [employer] of Occupational  Health - Occupational Stress Questionnaire    Feeling of Stress : Not at all  Social Connections: Socially Integrated (03/30/2022)   Social Connection and Isolation Panel [NHANES]    Frequency of Communication with Friends and Family: More than three times a week    Frequency of Social Gatherings with Friends and Family: More than three times a week    Attends Religious Services: More than 4 times per year    Active Member of [employer] or Organizations: Yes    Attends Engineer, structural: More than 4 times per year    Marital Status: Married  Tobacco Counseling Counseling given: Not Answered   Clinical Intake:  Pre-visit preparation completed: Yes  Pain : No/denies pain     BMI - recorded: 25.94 Nutritional Status: BMI 25 -29 Overweight Nutritional Risks: None Diabetes: No  How often do you need to have someone help you when you read instructions, pamphlets, or other written materials from your doctor or pharmacy?: 1 - Never  Diabetic?  No  Interpreter Needed?: No      Activities of Daily Living    03/30/2022   10:50 AM 03/29/2022   11:49 AM  In your present state of health, do you have any difficulty performing the following activities:  Hearing? 1 1  Comment Wears hearing aids   Vision? 0 0  Difficulty concentrating or making decisions? 0 0  Walking or climbing stairs? 0 0  Dressing or bathing? 0 0  Doing errands, shopping? 0 0  Preparing Food and eating ? N N  Using the Toilet? N N  In the past six months, have you accidently leaked urine? N N  Do you have problems with loss of bowel control? N N  Managing your Medications? N N  Managing your Finances? N N  Housekeeping or managing your Housekeeping? N N    Patient Care Team: Kristian Covey, MD as PCP - General  Indicate any recent Medical Services you may have received from other than [facility] providers in the past year (date may be approximate).     Assessment:   This is a routine wellness  examination for [place].  Hearing/Vision screen Hearing Screening - Comments:: Wears hearing aids Vision Screening - Comments:: Wears reading glasses - up to date with routine eye exams with Dr Emily Filbert   Dietary issues and exercise activities discussed: Current Exercise Habits: Home exercise routine, Type of exercise: walking, Time (Minutes): 60, Frequency (Times/Week): 5, Weekly Exercise (Minutes/Week): 300, Intensity: Moderate, Exercise limited by: None identified   Goals Addressed               This Visit's Progress     Lose 5 lbs (pt-stated)         Depression Screen    03/30/2022   10:49 AM 01/02/2022    9:11 AM 10/27/2019   11:07 AM 05/04/2018    7:38 AM 10/04/2014   10:08 AM  PHQ 2/9 Scores  PHQ - 2 Score 0 0 0 0 0    Fall Risk    03/29/2022   11:49 AM 03/05/2022    9:11 AM 01/02/2022    9:11 AM 10/04/2014   10:08 AM  Fall Risk   Falls in the past year? 0 0 0 No  Number falls in past yr: 0  0   Injury with Fall? 0  0   Risk for fall due to :   No Fall Risks   Follow up   Falls evaluation completed     FALL RISK PREVENTION PERTAINING TO THE HOME:  Any stairs in or around the home? Yes  If so, are there any without handrails? No  Home free of loose throw rugs in walkways, pet beds, electrical cords, etc? Yes  Adequate lighting in your home to reduce risk of falls? Yes   ASSISTIVE DEVICES UTILIZED TO PREVENT FALLS:  Life alert? No  Use of a cane, walker or w/c? No  Grab bars in the bathroom? No  Shower chair or bench in shower? No  Elevated toilet seat or a handicapped toilet? No   TIMED UP  AND GO:  Was the test performed? No . Audio Visit   Cognitive Function:        03/30/2022   10:53 AM  6CIT Screen  What Year? 0 points  What month? 0 points  What time? 0 points  Count back from 20 0 points  Months in reverse 0 points  Repeat phrase 0 points  Total Score 0 points    Immunizations Immunization History  Administered Date(s) Administered    Hepatitis A 07/13/2006, 12/12/2006   Influenza,inj,Quad PF,6+ Mos 04/26/2014   Influenza,inj,quad, With Preservative 03/15/2017   Influenza-Unspecified 04/13/2015, 03/18/2017, 05/07/2021   PFIZER(Purple Top)SARS-COV-2 Vaccination 08/22/2019, 09/18/2019, 03/25/2020, 11/01/2020   Td 07/14/2003   Tdap 10/05/2012   Zoster Recombinat (Shingrix) 12/31/2020, 05/27/2021    TDAP status: Up to date  Flu Vaccine status: Up to date  Pneumococcal vaccine status: Due, Education has been provided regarding the importance of this vaccine. Advised may receive this vaccine at local pharmacy or Health Dept. Aware to provide a copy of the vaccination record if obtained from local pharmacy or Health Dept. Verbalized acceptance and understanding.  Covid-19 vaccine status: Completed vaccines  Qualifies for Shingles Vaccine? Yes   Zostavax completed Yes   Shingrix Completed?: Yes  Screening Tests Health Maintenance  Topic Date Due   COVID-19 Vaccine (5 - Pfizer risk series) 04/15/2022 (Originally 12/27/2020)   INFLUENZA VACCINE  10/11/2022 (Originally 02/10/2022)   Pneumonia Vaccine 6965+ Years old (1 - PCV) 03/31/2023 (Originally 01/30/2020)   Hepatitis C Screening  04/28/2037 (Originally 01/29/1974)   TETANUS/TDAP  10/06/2022   COLONOSCOPY (Pts 45-480yrs Insurance coverage will need to be confirmed)  04/30/2026   Zoster Vaccines- Shingrix  Completed   HPV VACCINES  Aged Out    Health Maintenance  There are no preventive care reminders to display for this patient.   Colorectal cancer screening: Type of screening: Colonoscopy. Completed 04/30/16. Repeat every 10 years  Lung Cancer Screening: (Low Dose CT Chest recommended if Age 65-80 years, 30 pack-year currently smoking OR have quit w/in 15years.) does not qualify.     Additional Screening:  Hepatitis C Screening: does qualify; Completed Deferred  Vision Screening: Recommended annual ophthalmology exams for early detection of glaucoma and other  disorders of the eye. Is the patient up to date with their annual eye exam?  Yes  Who is the provider or what is the name of the office in which the patient attends annual eye exams? Dr Emily FilbertGould If pt is not established with a provider, would they like to be referred to a provider to establish care? No .   Dental Screening: Recommended annual dental exams for proper oral hygiene  Community Resource Referral / Chronic Care Management:  CRR required this visit?  No   CCM required this visit?  No      Plan:     I have personally reviewed and noted the following in the patient's chart:   Medical and social history Use of alcohol, tobacco or illicit drugs  Current medications and supplements including opioid prescriptions. Patient is not currently taking opioid prescriptions. Functional ability and status Nutritional status Physical activity Advanced directives List of other physicians Hospitalizations, surgeries, and ER visits in previous 12 months Vitals Screenings to include cognitive, depression, and falls Referrals and appointments  In addition, I have reviewed and discussed with patient certain preventive protocols, quality metrics, and best practice recommendations. A written personalized care plan for preventive services as well as general preventive health recommendations were provided to patient.  Criselda Peaches, LPN   2/75/1700   Nurse Notes: None

## 2022-03-30 NOTE — Patient Instructions (Addendum)
Edwin [name] , Thank you for taking time to come for your Medicare Wellness Visit. I appreciate your ongoing commitment to your health goals. Please review the following plan we discussed and let me know if I can assist you in the future.   Screening recommendations/referrals: Colonoscopy: Done  Recommended yearly ophthalmology/optometry visit for glaucoma screening and checkup Recommended yearly dental visit for hygiene and checkup  Vaccinations: Influenza vaccine: Up to date Pneumococcal vaccine: Deferred Tdap vaccine: Up to date Shingles vaccine: Done   Covid-19: Done  Advanced directives: Please bring a copy of your health care power of attorney and living will to the office to be added to your chart at your convenience.   Conditions/risks identified: None  Next appointment: Follow up in one year for your annual wellness visit.    Preventive Care 39 Years and Older, Male  Preventive care refers to lifestyle choices and visits with your health care provider that can promote health and wellness. What does preventive care include? A yearly physical exam. This is also called an annual well check. Dental exams once or twice a year. Routine eye exams. Ask your health care provider how often you should have your eyes checked. Personal lifestyle choices, including: Daily care of your teeth and gums. Regular physical activity. Eating a healthy diet. Avoiding tobacco and drug use. Limiting alcohol use. Practicing safe sex. Taking low doses of aspirin every day. Taking vitamin and mineral supplements as recommended by your health care provider. What happens during an annual well check? The services and screenings done by your health care provider during your annual well check will depend on your age, overall health, lifestyle risk factors, and family history of disease. Counseling  Your health care provider may ask you questions about your: Alcohol use. Tobacco use. Drug  use. Emotional well-being. Home and relationship well-being. Sexual activity. Eating habits. History of falls. Memory and ability to understand (cognition). Work and work Statistician. Screening  You may have the following tests or measurements: Height, weight, and BMI. Blood pressure. Lipid and cholesterol levels. These may be checked every 5 years, or more frequently if you are over 20 years old. Skin check. Lung cancer screening. You may have this screening every year starting at age 28 if you have a 30-pack-year history of smoking and currently smoke or have quit within the past 15 years. Fecal occult blood test (FOBT) of the stool. You may have this test every year starting at age 67. Flexible sigmoidoscopy or colonoscopy. You may have a sigmoidoscopy every 5 years or a colonoscopy every 10 years starting at age 32. Prostate cancer screening. Recommendations will vary depending on your family history and other risks. Hepatitis C blood test. Hepatitis B blood test. Sexually transmitted disease (STD) testing. Diabetes screening. This is done by checking your blood sugar (glucose) after you have not eaten for a while (fasting). You may have this done every 1-3 years. Abdominal aortic aneurysm (AAA) screening. You may need this if you are a current or former smoker. Osteoporosis. You may be screened starting at age 81 if you are at high risk. Talk with your health care provider about your test results, treatment options, and if necessary, the need for more tests. Vaccines  Your health care provider may recommend certain vaccines, such as: Influenza vaccine. This is recommended every year. Tetanus, diphtheria, and acellular pertussis (Tdap, Td) vaccine. You may need a Td booster every 10 years. Zoster vaccine. You may need this after age 52. Pneumococcal 13-valent  conjugate (PCV13) vaccine. One dose is recommended after age 37. Pneumococcal polysaccharide (PPSV23) vaccine. One dose is  recommended after age 75. Talk to your health care provider about which screenings and vaccines you need and how often you need them. This information is not intended to replace advice given to you by your health care provider. Make sure you discuss any questions you have with your health care provider. Document Released: 07/26/2015 Document Revised: 03/18/2016 Document Reviewed: 04/30/2015 Elsevier Interactive Patient Education  2017 [place] Prevention in the Home Falls can cause injuries. They can happen to people of all ages. There are many things you can do to make your home safe and to help prevent falls. What can I do on the outside of my home? Regularly fix the edges of walkways and driveways and fix any cracks. Remove anything that might make you trip as you walk through a door, such as a raised step or threshold. Trim any bushes or trees on the path to your home. Use bright outdoor lighting. Clear any walking paths of anything that might make someone trip, such as rocks or tools. Regularly check to see if handrails are loose or broken. Make sure that both sides of any steps have handrails. Any raised decks and porches should have guardrails on the edges. Have any leaves, snow, or ice cleared regularly. Use sand or salt on walking paths during winter. Clean up any spills in your garage right away. This includes oil or grease spills. What can I do in the bathroom? Use night lights. Install grab bars by the toilet and in the tub and shower. Do not use towel bars as grab bars. Use non-skid mats or decals in the tub or shower. If you need to sit down in the shower, use a plastic, non-slip stool. Keep the floor dry. Clean up any water that spills on the floor as soon as it happens. Remove soap buildup in the tub or shower regularly. Attach bath mats securely with double-sided non-slip rug tape. Do not have throw rugs and other things on the floor that can make you  trip. What can I do in the bedroom? Use night lights. Make sure that you have a light by your bed that is easy to reach. Do not use any sheets or blankets that are too big for your bed. They should not hang down onto the floor. Have a firm chair that has side arms. You can use this for support while you get dressed. Do not have throw rugs and other things on the floor that can make you trip. What can I do in the kitchen? Clean up any spills right away. Avoid walking on wet floors. Keep items that you use a lot in easy-to-reach places. If you need to reach something above you, use a strong step stool that has a grab bar. Keep electrical cords out of the way. Do not use floor polish or wax that makes floors slippery. If you must use wax, use non-skid floor wax. Do not have throw rugs and other things on the floor that can make you trip. What can I do with my stairs? Do not leave any items on the stairs. Make sure that there are handrails on both sides of the stairs and use them. Fix handrails that are broken or loose. Make sure that handrails are as long as the stairways. Check any carpeting to make sure that it is firmly attached to the stairs. Fix any carpet that  is loose or worn. Avoid having throw rugs at the top or bottom of the stairs. If you do have throw rugs, attach them to the floor with carpet tape. Make sure that you have a light switch at the top of the stairs and the bottom of the stairs. If you do not have them, ask someone to add them for you. What else can I do to help prevent falls? Wear shoes that: Do not have high heels. Have rubber bottoms. Are comfortable and fit you well. Are closed at the toe. Do not wear sandals. If you use a stepladder: Make sure that it is fully opened. Do not climb a closed stepladder. Make sure that both sides of the stepladder are locked into place. Ask someone to hold it for you, if possible. Clearly mark and make sure that you can  see: Any grab bars or handrails. First and last steps. Where the edge of each step is. Use tools that help you move around (mobility aids) if they are needed. These include: Canes. Walkers. Scooters. Crutches. Turn on the lights when you go into a dark area. Replace any light bulbs as soon as they burn out. Set up your furniture so you have a clear path. Avoid moving your furniture around. If any of your floors are uneven, fix them. If there are any pets around you, be aware of where they are. Review your medicines with your doctor. Some medicines can make you feel dizzy. This can increase your chance of falling. Ask your doctor what other things that you can do to help prevent falls. This information is not intended to replace advice given to you by your health care provider. Make sure you discuss any questions you have with your health care provider. Document Released: 04/25/2009 Document Revised: 12/05/2015 Document Reviewed: 08/03/2014 Elsevier Interactive Patient Education  2017 [employer].

## 2022-04-07 DIAGNOSIS — I480 Paroxysmal atrial fibrillation: Secondary | ICD-10-CM | POA: Diagnosis not present

## 2022-06-09 DIAGNOSIS — Z01818 Encounter for other preprocedural examination: Secondary | ICD-10-CM | POA: Diagnosis not present

## 2022-06-09 DIAGNOSIS — I480 Paroxysmal atrial fibrillation: Secondary | ICD-10-CM | POA: Diagnosis not present

## 2022-06-12 DIAGNOSIS — Z8679 Personal history of other diseases of the circulatory system: Secondary | ICD-10-CM | POA: Diagnosis not present

## 2022-06-12 DIAGNOSIS — I480 Paroxysmal atrial fibrillation: Secondary | ICD-10-CM | POA: Diagnosis not present

## 2022-06-12 DIAGNOSIS — Z01810 Encounter for preprocedural cardiovascular examination: Secondary | ICD-10-CM | POA: Diagnosis not present

## 2022-06-12 DIAGNOSIS — Z7901 Long term (current) use of anticoagulants: Secondary | ICD-10-CM | POA: Diagnosis not present

## 2022-06-16 DIAGNOSIS — Z86018 Personal history of other benign neoplasm: Secondary | ICD-10-CM | POA: Diagnosis not present

## 2022-06-16 DIAGNOSIS — Z7901 Long term (current) use of anticoagulants: Secondary | ICD-10-CM | POA: Diagnosis not present

## 2022-06-16 DIAGNOSIS — I4589 Other specified conduction disorders: Secondary | ICD-10-CM | POA: Diagnosis not present

## 2022-06-16 DIAGNOSIS — Z9889 Other specified postprocedural states: Secondary | ICD-10-CM | POA: Diagnosis not present

## 2022-06-16 DIAGNOSIS — Z79899 Other long term (current) drug therapy: Secondary | ICD-10-CM | POA: Diagnosis not present

## 2022-06-16 DIAGNOSIS — I480 Paroxysmal atrial fibrillation: Secondary | ICD-10-CM | POA: Diagnosis not present

## 2022-06-16 DIAGNOSIS — Z87891 Personal history of nicotine dependence: Secondary | ICD-10-CM | POA: Diagnosis not present

## 2022-06-16 DIAGNOSIS — R9431 Abnormal electrocardiogram [ECG] [EKG]: Secondary | ICD-10-CM | POA: Diagnosis not present

## 2022-06-17 DIAGNOSIS — Z86018 Personal history of other benign neoplasm: Secondary | ICD-10-CM | POA: Diagnosis not present

## 2022-06-17 DIAGNOSIS — Z8679 Personal history of other diseases of the circulatory system: Secondary | ICD-10-CM | POA: Diagnosis not present

## 2022-06-17 DIAGNOSIS — R9431 Abnormal electrocardiogram [ECG] [EKG]: Secondary | ICD-10-CM | POA: Diagnosis not present

## 2022-06-17 DIAGNOSIS — Z7901 Long term (current) use of anticoagulants: Secondary | ICD-10-CM | POA: Diagnosis not present

## 2022-06-17 DIAGNOSIS — I4589 Other specified conduction disorders: Secondary | ICD-10-CM | POA: Diagnosis not present

## 2022-06-17 DIAGNOSIS — I480 Paroxysmal atrial fibrillation: Secondary | ICD-10-CM | POA: Diagnosis not present

## 2022-06-17 DIAGNOSIS — Z9889 Other specified postprocedural states: Secondary | ICD-10-CM | POA: Diagnosis not present

## 2022-06-17 DIAGNOSIS — Z87891 Personal history of nicotine dependence: Secondary | ICD-10-CM | POA: Diagnosis not present

## 2022-06-17 DIAGNOSIS — Z79899 Other long term (current) drug therapy: Secondary | ICD-10-CM | POA: Diagnosis not present

## 2022-08-14 ENCOUNTER — Ambulatory Visit: Payer: Medicare PPO | Admitting: Family Medicine

## 2022-08-14 ENCOUNTER — Encounter: Payer: Self-pay | Admitting: Family Medicine

## 2022-08-14 ENCOUNTER — Telehealth: Payer: Self-pay | Admitting: Family Medicine

## 2022-08-14 VITALS — BP 110/70 | HR 40 | Temp 98.2°F | Ht 71.0 in | Wt 188.1 lb

## 2022-08-14 DIAGNOSIS — R110 Nausea: Secondary | ICD-10-CM

## 2022-08-14 DIAGNOSIS — R42 Dizziness and giddiness: Secondary | ICD-10-CM

## 2022-08-14 DIAGNOSIS — I480 Paroxysmal atrial fibrillation: Secondary | ICD-10-CM | POA: Diagnosis not present

## 2022-08-14 LAB — POCT INFLUENZA A/B
Influenza A, POC: NEGATIVE
Influenza B, POC: NEGATIVE

## 2022-08-14 LAB — POC COVID19 BINAXNOW: SARS Coronavirus 2 Ag: NEGATIVE

## 2022-08-14 MED ORDER — ONDANSETRON 8 MG PO TBDP: 8.0000 mg | ORAL_TABLET | Freq: Three times a day (TID) | ORAL | 0 refills | Status: DC | PRN

## 2022-08-14 NOTE — Telephone Encounter (Signed)
Patient was contacted by PCP

## 2022-08-14 NOTE — Progress Notes (Signed)
Established Patient Office Visit  Subjective   Patient ID: Edwin [name], male    DOB: 15-Jan-1956  Age: 67 y.o. MRN: [id number]  Chief Complaint  Patient presents with   Dizziness    Patient complains of dizziness, x1 day, Patient reports no blurred, double vision or headaches    Gastroesophageal Reflux    X1 day    Fatigue    X1 day     HPI   Max has history of atrial fibrillation, osteoarthritis, recurrent sinusitis.  He had recent ablation procedure December 6 at [facility].  No longer taking beta-blockers.  He is seen today following some dizziness and fatigue when playing tennis yesterday around 6 PM.  He recalls eating a fairly heavy [place] lunch around 2 PM.  He went to play tennis around 6 PM after just 20 minutes or so play felt almost presyncopal and dizzy and fatigued and stopped.  Denied any chest pain.  He drove home at that point.  Today's had some queasiness and intermittent nausea but no vomiting at this point.  He had some intermittent GERD symptoms and does not take anything for that.  He does remain on Eliquis following his ablation.  Apparently had very little to drink today with estimated about 16 ounces.  No recent diarrhea.  No abdominal pain.  Feels a little bit lightheaded occasionally but no severe orthostatic symptoms today.  Denies any fever.  No known sick contacts.  Past Medical History:  Diagnosis Date   Allergy    Arthritis    Family history of adverse reaction to anesthesia    pts father had back surgery 10 years ago developed dementia    Nonobstructive atherosclerosis of coronary artery 2016   cardiac CT   Sinus infection    Past Surgical History:  Procedure Laterality Date   KNEE SURGERY  2009   microfracture surgery; left    OTHER SURGICAL HISTORY  12/02/2016   sinus    ROOT CANAL     TONSILLECTOMY     TOTAL HIP ARTHROPLASTY Right 12/02/2015   Procedure: RIGHT TOTAL HIP ARTHROPLASTY ANTERIOR APPROACH;  Surgeon: Gaynelle Arabian, MD;   Location: [facility];  Service: Orthopedics;  Laterality: Right;    reports that he quit smoking about 44 years ago. His smoking use included cigarettes. He has a 5.00 pack-year smoking history. He has never used smokeless tobacco. He reports current alcohol use of about 4.0 standard drinks of alcohol per week. He reports that he does not use drugs. family history includes Atrial fibrillation (age of onset: 87) in his brother; Atrial fibrillation (age of onset: 74) in his mother; CAD in his brother; CAD (age of onset: 33) in his paternal uncle; CAD (age of onset: 57) in his brother; Dementia in his father and mother; Hyperlipidemia in his father; Multiple sclerosis in his brother. Allergies  Allergen Reactions   Molds & Smuts Shortness Of Breath and Other (See Comments)   Quercus Robur [place] Other (See Comments)    Review of Systems  Constitutional:  Negative for chills and fever.  Respiratory:  Negative for cough, sputum production and shortness of breath.   Cardiovascular:  Negative for chest pain.  Gastrointestinal:  Positive for heartburn and nausea. Negative for blood in stool, diarrhea and melena.  Genitourinary:  Negative for dysuria.  Neurological:  Positive for dizziness. Negative for loss of consciousness.      Objective:     BP 110/70 (BP Location:  Left Arm, Patient Position: Sitting, Cuff Size: Normal)   Pulse (!) 40   Temp 98.2 F (36.8 C) (Oral)   Ht 5\' 11"  (1.803 m)   Wt 188 lb 1.6 oz (85.3 kg)   SpO2 98%   BMI 26.23 kg/m    Physical Exam Vitals reviewed.  Constitutional:      General: He is not in acute distress. Cardiovascular:     Comments: Initial palpated heart rate was around 44-46.  This was later checked and up around 58 and around 58-59 on EKG. Pulmonary:     Effort: Pulmonary effort is normal.     Breath sounds: Normal breath sounds. No wheezing or rales.  Abdominal:     Palpations: Abdomen is soft.     Tenderness:  There is no abdominal tenderness.  Neurological:     Mental Status: He is alert.      Results for orders placed or performed in visit on 08/14/22  POC COVID-19  Result Value Ref Range   SARS Coronavirus 2 Ag Negative Negative  POC Influenza A/B  Result Value Ref Range   Influenza A, POC Negative Negative   Influenza B, POC Negative Negative      The 10-year ASCVD risk score (Arnett DK, et al., 2019) is: 12.7%    Assessment & Plan:   #1 patient presents with 1 day history of malaise, intermittent dizziness, nausea.  When we were in the room in the middle of examining him he became acutely nauseous and had significant amount of emesis.  Felt much better afterwards.  No chest pain.  -We went ahead with influenza and COVID testing which were both negative -Patient had significant symptomatic improvement after vomiting. -?  Viral enteritis although he has no diarrhea. -Prescription for Zofran 8 mg every 8 hours as needed for nausea/vomiting -Try sips on clear liquids after he gets home and hopefully can progress fluid intake over the next several hours -He has had some GERD symptoms increase past day and we recommend getting back on Nexium over-the-counter 20 mg once daily until symptoms settle  #2 dizziness.  Blood pressure by my repeat seated 98/60 and identical pressure was obtained standing.  May be a little volume contracted. -Obtain EKG which shows mild sinus bradycardia with heart rate around 59.  No evidence for significant heart block.  Mild QRS widening which appears to be old.  No acute changes. -Check CBC, CMP, and magnesium   -Doubt cardiac etiology.  #3 history of atrial fibrillation.  Status post ablation back in December.  Sinus rhythm at this time.  Patient remains on Eliquis.  No beta-blocker use.   No follow-ups on file.    Carolann Littler, MD

## 2022-08-14 NOTE — Telephone Encounter (Signed)
Pt said he just missed a call from the office

## 2022-08-14 NOTE — Addendum Note (Signed)
Addended by: Nilda Riggs on: 08/14/2022 05:08 PM   Modules accepted: Orders

## 2022-08-15 LAB — COMPREHENSIVE METABOLIC PANEL
AG Ratio: 2.1 (calc) (ref 1.0–2.5)
ALT: 19 U/L (ref 9–46)
AST: 21 U/L (ref 10–35)
Albumin: 4.5 g/dL (ref 3.6–5.1)
Alkaline phosphatase (APISO): 72 U/L (ref 35–144)
BUN: 21 mg/dL (ref 7–25)
CO2: 26 mmol/L (ref 20–32)
Calcium: 9.1 mg/dL (ref 8.6–10.3)
Chloride: 107 mmol/L (ref 98–110)
Creat: 1.07 mg/dL (ref 0.70–1.35)
Globulin: 2.1 g/dL (calc) (ref 1.9–3.7)
Glucose, Bld: 131 mg/dL — ABNORMAL HIGH (ref 65–99)
Potassium: 5.1 mmol/L (ref 3.5–5.3)
Sodium: 143 mmol/L (ref 135–146)
Total Bilirubin: 0.9 mg/dL (ref 0.2–1.2)
Total Protein: 6.6 g/dL (ref 6.1–8.1)

## 2022-08-15 LAB — CBC WITH DIFFERENTIAL/PLATELET
Absolute Monocytes: 524 cells/uL (ref 200–950)
Basophils Absolute: 7 cells/uL (ref 0–200)
Basophils Relative: 0.1 %
Eosinophils Absolute: 7 cells/uL — ABNORMAL LOW (ref 15–500)
Eosinophils Relative: 0.1 %
HCT: 46.1 % (ref 38.5–50.0)
Hemoglobin: 16.7 g/dL (ref 13.2–17.1)
Lymphs Abs: 496 cells/uL — ABNORMAL LOW (ref 850–3900)
MCH: 34.3 pg — ABNORMAL HIGH (ref 27.0–33.0)
MCHC: 36.2 g/dL — ABNORMAL HIGH (ref 32.0–36.0)
MCV: 94.7 fL (ref 80.0–100.0)
MPV: 10.5 fL (ref 7.5–12.5)
Monocytes Relative: 7.7 %
Neutro Abs: 5766 cells/uL (ref 1500–7800)
Neutrophils Relative %: 84.8 %
Platelets: 170 10*3/uL (ref 140–400)
RBC: 4.87 10*6/uL (ref 4.20–5.80)
RDW: 12.1 % (ref 11.0–15.0)
Total Lymphocyte: 7.3 %
WBC: 6.8 10*3/uL (ref 3.8–10.8)

## 2022-08-15 LAB — MAGNESIUM: Magnesium: 2.3 mg/dL (ref 1.5–2.5)

## 2022-08-19 ENCOUNTER — Encounter: Payer: Self-pay | Admitting: Family Medicine

## 2022-08-21 ENCOUNTER — Ambulatory Visit (INDEPENDENT_AMBULATORY_CARE_PROVIDER_SITE_OTHER): Payer: Medicare PPO | Admitting: Family Medicine

## 2022-08-21 ENCOUNTER — Encounter: Payer: Self-pay | Admitting: Family Medicine

## 2022-08-21 VITALS — BP 116/70 | HR 58 | Temp 97.9°F | Ht 71.0 in | Wt 181.7 lb

## 2022-08-21 DIAGNOSIS — R42 Dizziness and giddiness: Secondary | ICD-10-CM

## 2022-08-21 NOTE — Telephone Encounter (Signed)
Patient scheduled for today at 10 AM

## 2022-08-21 NOTE — Progress Notes (Signed)
Established Patient Office Visit  Subjective   Patient ID: Edwin [name], male    DOB: 02-26-1956  Age: 67 y.o. MRN: [id number]  Chief Complaint  Patient presents with   Dizziness    HPI   Edwin [name] is seen with complaint of some lightheadedness since his GI bug last week.  He was seen with some dizziness and fatigue and actually when here in office had sudden episode of emesis.  He felt actually much better after the episode of emesis but did have 3 more days of some light diarrhea.  He overall feels better but still has some lightheadedness occasionally but not consistently with position change.  We obtained several labs last week including CBC, CMP, magnesium level and these were all basically unremarkable.  His EKG showed sinus rhythm.  He had initial low pulse but this seemed to come up after he had been here several minutes.  He does remain on Eliquis.  He states he is drinking about 32 ounces of fluid per day.  He is ready to start back tennis soon.  He has done some walking without difficulty.  No recent chest pain.  No vertigo.  No syncope.  No palpitations.  No dyspnea.  He is not taking any antihypertensive medications at this time.  His nausea, vomiting, and diarrhea have fully resolved at this time.  He also had recent COVID and influenza testing on day of visit last Friday and these were negative.  Past Medical History:  Diagnosis Date   Allergy    Arthritis    Family history of adverse reaction to anesthesia    pts father had back surgery 10 years ago developed dementia    Nonobstructive atherosclerosis of coronary artery 2016   cardiac CT   Sinus infection    Past Surgical History:  Procedure Laterality Date   KNEE SURGERY  2009   microfracture surgery; left    OTHER SURGICAL HISTORY  12/02/2016   sinus    ROOT CANAL     TONSILLECTOMY     TOTAL HIP ARTHROPLASTY Right 12/02/2015   Procedure: RIGHT TOTAL HIP ARTHROPLASTY ANTERIOR APPROACH;  Surgeon: Gaynelle Arabian,  MD;  Location: [facility];  Service: Orthopedics;  Laterality: Right;    reports that he quit smoking about 44 years ago. His smoking use included cigarettes. He has a 5.00 pack-year smoking history. He has never used smokeless tobacco. He reports current alcohol use of about 4.0 standard drinks of alcohol per week. He reports that he does not use drugs. family history includes Atrial fibrillation (age of onset: 51) in his brother; Atrial fibrillation (age of onset: 5) in his mother; CAD in his brother; CAD (age of onset: 5) in his paternal uncle; CAD (age of onset: 77) in his brother; Dementia in his father and mother; Hyperlipidemia in his father; Multiple sclerosis in his brother. Allergies  Allergen Reactions   Molds & Smuts Shortness Of Breath and Other (See Comments)   Quercus Robur [place] Other (See Comments)    Review of Systems  Constitutional:  Negative for chills and fever.  Respiratory:  Negative for cough.   Cardiovascular:  Negative for chest pain and leg swelling.  Gastrointestinal:  Negative for abdominal pain, diarrhea, nausea and vomiting.  Neurological:  Positive for dizziness. Negative for focal weakness, loss of consciousness and weakness.      Objective:     BP 116/70 (BP Location: Left Arm, Patient Position: Sitting, Cuff Size:  Normal)   Pulse (!) 58   Temp 97.9 F (36.6 C) (Oral)   Ht 5' 11"$  (1.803 m)   Wt 181 lb 11.2 oz (82.4 kg)   SpO2 95%   BMI 25.34 kg/m    Physical Exam Vitals reviewed.  Constitutional:      General: He is not in acute distress.    Appearance: He is not ill-appearing.  Cardiovascular:     Rate and Rhythm: Normal rate and regular rhythm.  Pulmonary:     Effort: Pulmonary effort is normal.     Breath sounds: Normal breath sounds. No wheezing or rales.  Musculoskeletal:     Cervical back: Neck supple.  Neurological:     General: No focal deficit present.     Mental Status: He is alert.       No results found for any visits on 08/21/22.    The 10-year ASCVD risk score (Arnett DK, et al., 2019) is: 13.9%    Assessment & Plan:   Edwin [name] is seen with some nonspecific lightheadedness following recent presumed viral gastroenteritis.  His GI symptoms have fully resolved.  He appears to be well-hydrated.  Cardiac exam is unremarkable.  He appears to be in sinus rhythm with normal rate at this time.  Recent labs unremarkable.  Recent EKG last Friday confirms sinus rhythm.  Blood pressure lying 118/70 and standing and sitting 116/70 with no tachycardia.  -We stressed the importance of adequate hydration which he appears to be doing -He does not demonstrate any orthostatic changes but change positions gradually/slowly -Gradually resume his usual activities.  He will try some mild resistance training and cycling and continue walking for couple days before trying tennis.   Carolann Littler, MD

## 2022-08-25 DIAGNOSIS — Z8679 Personal history of other diseases of the circulatory system: Secondary | ICD-10-CM | POA: Diagnosis not present

## 2022-08-25 DIAGNOSIS — I480 Paroxysmal atrial fibrillation: Secondary | ICD-10-CM | POA: Diagnosis not present

## 2022-08-25 DIAGNOSIS — Z9889 Other specified postprocedural states: Secondary | ICD-10-CM | POA: Diagnosis not present

## 2022-08-25 DIAGNOSIS — Z7901 Long term (current) use of anticoagulants: Secondary | ICD-10-CM | POA: Diagnosis not present

## 2022-08-31 DIAGNOSIS — H00015 Hordeolum externum left lower eyelid: Secondary | ICD-10-CM | POA: Diagnosis not present

## 2022-08-31 DIAGNOSIS — H0100B Unspecified blepharitis left eye, upper and lower eyelids: Secondary | ICD-10-CM | POA: Diagnosis not present

## 2022-09-02 DIAGNOSIS — L578 Other skin changes due to chronic exposure to nonionizing radiation: Secondary | ICD-10-CM | POA: Diagnosis not present

## 2022-09-02 DIAGNOSIS — L859 Epidermal thickening, unspecified: Secondary | ICD-10-CM | POA: Diagnosis not present

## 2022-09-02 DIAGNOSIS — L570 Actinic keratosis: Secondary | ICD-10-CM | POA: Diagnosis not present

## 2022-09-02 DIAGNOSIS — L2084 Intrinsic (allergic) eczema: Secondary | ICD-10-CM | POA: Diagnosis not present

## 2022-09-02 DIAGNOSIS — D225 Melanocytic nevi of trunk: Secondary | ICD-10-CM | POA: Diagnosis not present

## 2022-09-02 DIAGNOSIS — D485 Neoplasm of uncertain behavior of skin: Secondary | ICD-10-CM | POA: Diagnosis not present

## 2022-09-02 DIAGNOSIS — D2272 Melanocytic nevi of left lower limb, including hip: Secondary | ICD-10-CM | POA: Diagnosis not present

## 2022-09-02 DIAGNOSIS — L814 Other melanin hyperpigmentation: Secondary | ICD-10-CM | POA: Diagnosis not present

## 2022-09-02 DIAGNOSIS — Z86018 Personal history of other benign neoplasm: Secondary | ICD-10-CM | POA: Diagnosis not present

## 2022-09-02 DIAGNOSIS — L821 Other seborrheic keratosis: Secondary | ICD-10-CM | POA: Diagnosis not present

## 2022-09-02 DIAGNOSIS — L723 Sebaceous cyst: Secondary | ICD-10-CM | POA: Diagnosis not present

## 2022-09-21 DIAGNOSIS — H0015 Chalazion left lower eyelid: Secondary | ICD-10-CM | POA: Diagnosis not present

## 2022-09-23 DIAGNOSIS — I484 Atypical atrial flutter: Secondary | ICD-10-CM | POA: Diagnosis not present

## 2022-09-23 DIAGNOSIS — Z9889 Other specified postprocedural states: Secondary | ICD-10-CM | POA: Diagnosis not present

## 2022-09-23 DIAGNOSIS — I480 Paroxysmal atrial fibrillation: Secondary | ICD-10-CM | POA: Diagnosis not present

## 2022-10-20 ENCOUNTER — Other Ambulatory Visit: Payer: Self-pay | Admitting: Family Medicine

## 2022-10-23 DIAGNOSIS — Z01818 Encounter for other preprocedural examination: Secondary | ICD-10-CM | POA: Diagnosis not present

## 2022-10-23 DIAGNOSIS — D3611 Benign neoplasm of peripheral nerves and autonomic nervous system of face, head, and neck: Secondary | ICD-10-CM | POA: Diagnosis not present

## 2022-10-23 NOTE — Telephone Encounter (Signed)
Ok to refill 

## 2022-11-10 DIAGNOSIS — J339 Nasal polyp, unspecified: Secondary | ICD-10-CM | POA: Diagnosis not present

## 2022-11-10 DIAGNOSIS — Z9889 Other specified postprocedural states: Secondary | ICD-10-CM | POA: Diagnosis not present

## 2022-11-10 DIAGNOSIS — J3489 Other specified disorders of nose and nasal sinuses: Secondary | ICD-10-CM | POA: Diagnosis not present

## 2022-11-10 DIAGNOSIS — J324 Chronic pansinusitis: Secondary | ICD-10-CM | POA: Diagnosis not present

## 2022-11-10 DIAGNOSIS — R0989 Other specified symptoms and signs involving the circulatory and respiratory systems: Secondary | ICD-10-CM | POA: Diagnosis not present

## 2022-11-10 DIAGNOSIS — J301 Allergic rhinitis due to pollen: Secondary | ICD-10-CM | POA: Diagnosis not present

## 2022-11-13 DIAGNOSIS — I480 Paroxysmal atrial fibrillation: Secondary | ICD-10-CM | POA: Diagnosis not present

## 2022-11-13 DIAGNOSIS — Z01818 Encounter for other preprocedural examination: Secondary | ICD-10-CM | POA: Diagnosis not present

## 2022-11-20 DIAGNOSIS — Z87891 Personal history of nicotine dependence: Secondary | ICD-10-CM | POA: Diagnosis not present

## 2022-11-20 DIAGNOSIS — Z79899 Other long term (current) drug therapy: Secondary | ICD-10-CM | POA: Diagnosis not present

## 2022-11-20 DIAGNOSIS — Z7901 Long term (current) use of anticoagulants: Secondary | ICD-10-CM | POA: Diagnosis not present

## 2022-11-20 DIAGNOSIS — I480 Paroxysmal atrial fibrillation: Secondary | ICD-10-CM | POA: Diagnosis not present

## 2022-11-20 DIAGNOSIS — Z5181 Encounter for therapeutic drug level monitoring: Secondary | ICD-10-CM | POA: Diagnosis not present

## 2022-11-20 DIAGNOSIS — I4892 Unspecified atrial flutter: Secondary | ICD-10-CM | POA: Diagnosis not present

## 2022-11-21 DIAGNOSIS — Z87891 Personal history of nicotine dependence: Secondary | ICD-10-CM | POA: Diagnosis not present

## 2022-11-21 DIAGNOSIS — I4892 Unspecified atrial flutter: Secondary | ICD-10-CM | POA: Diagnosis not present

## 2022-11-21 DIAGNOSIS — Z8679 Personal history of other diseases of the circulatory system: Secondary | ICD-10-CM | POA: Diagnosis not present

## 2022-11-21 DIAGNOSIS — Z5181 Encounter for therapeutic drug level monitoring: Secondary | ICD-10-CM | POA: Diagnosis not present

## 2022-11-21 DIAGNOSIS — Z79899 Other long term (current) drug therapy: Secondary | ICD-10-CM | POA: Diagnosis not present

## 2022-11-21 DIAGNOSIS — Z7901 Long term (current) use of anticoagulants: Secondary | ICD-10-CM | POA: Diagnosis not present

## 2022-11-21 DIAGNOSIS — I480 Paroxysmal atrial fibrillation: Secondary | ICD-10-CM | POA: Diagnosis not present

## 2022-11-27 DIAGNOSIS — Z461 Encounter for fitting and adjustment of hearing aid: Secondary | ICD-10-CM | POA: Diagnosis not present

## 2022-11-27 DIAGNOSIS — H903 Sensorineural hearing loss, bilateral: Secondary | ICD-10-CM | POA: Diagnosis not present

## 2022-11-27 DIAGNOSIS — Z01118 Encounter for examination of ears and hearing with other abnormal findings: Secondary | ICD-10-CM | POA: Diagnosis not present

## 2022-12-02 ENCOUNTER — Encounter: Payer: Self-pay | Admitting: Family Medicine

## 2022-12-02 ENCOUNTER — Ambulatory Visit: Payer: Medicare PPO | Admitting: Family Medicine

## 2022-12-02 VITALS — BP 118/70 | HR 69 | Temp 97.9°F | Wt 186.0 lb

## 2022-12-02 DIAGNOSIS — S301XXA Contusion of abdominal wall, initial encounter: Secondary | ICD-10-CM

## 2022-12-02 NOTE — Progress Notes (Signed)
   Subjective:    Patient ID: FELIKS LEMIEUX, male    DOB: [birth year], 67 y.o.   MRN: [id number]  HPI Here with a concern about a lump he felt in the left groin a few days ago. On 11-20-22 he had a cardiac ablation at [facility] for atrial fibrillation. This was successful and he has felt great since then. The lump he feels is at the surgical site.    Review of Systems  Constitutional: Negative.   Respiratory: Negative.    Cardiovascular: Negative.        Objective:   Physical Exam Constitutional:      Appearance: Normal appearance.  Cardiovascular:     Rate and Rhythm: Normal rate and regular rhythm.     Pulses: Normal pulses.     Heart sounds: Normal heart sounds.  Pulmonary:     Effort: Pulmonary effort is normal.     Breath sounds: Normal breath sounds.  Skin:    Comments: There is a small firm mobile non-tender mass just unde the skin in the left groin at a wound site   Neurological:     Mental Status: He is alert.           Assessment & Plan:  This is a small hematoma from the procedure, and I explained that this is common and is quite benign. This will slowly resolve over the next few weeks.  Gershon Crane, MD

## 2023-01-18 ENCOUNTER — Ambulatory Visit: Payer: Medicare PPO | Admitting: Family Medicine

## 2023-01-18 ENCOUNTER — Encounter: Payer: Self-pay | Admitting: Family Medicine

## 2023-01-18 VITALS — BP 110/62 | HR 65 | Temp 98.2°F | Ht 71.0 in | Wt 181.9 lb

## 2023-01-18 DIAGNOSIS — R058 Other specified cough: Secondary | ICD-10-CM

## 2023-01-18 MED ORDER — CEFDINIR 300 MG PO CAPS: 300.0000 mg | ORAL_CAPSULE | Freq: Two times a day (BID) | ORAL | 0 refills | Status: DC

## 2023-01-18 NOTE — Progress Notes (Signed)
Established Patient Office Visit  Subjective   Patient ID: Edwin [name], male    DOB: [birth year]  Age: 67 y.o. MRN: [id number]  Chief Complaint  Patient presents with   Sore Throat    Patient complains of sore throat, x6 days,    Nasal Congestion    Patient complains of nasal congestion, x6 days    Cough    Patient complains of cough, x6 days, Productive with green sputum     HPI   Till is seen with about 6-day history of sore throat, nasal congestion, cough productive of green sputum.  Symptoms started after he picked up his daughter and granddaughter who had similar symptoms.  His symptoms start about 6 days ago.  Home COVID test negative.  No fever.  No headaches.  His main concern is a has had recurrent sinusitis in the past  He has history of recurrent atrial fibrillation and had a second ablation procedure done recently and has felt great since then overall.  No evidence for recurrent A-fib.  Does remain on Eliquis.  Past Medical History:  Diagnosis Date   Allergy    Arthritis    Family history of adverse reaction to anesthesia    pts father had back surgery 10 years ago developed dementia    Nonobstructive atherosclerosis of coronary artery 2016   cardiac CT   Sinus infection    Past Surgical History:  Procedure Laterality Date   KNEE SURGERY  2009   microfracture surgery; left    OTHER SURGICAL HISTORY  12/02/2016   sinus    ROOT CANAL     TONSILLECTOMY     TOTAL HIP ARTHROPLASTY Right 12/02/2015   Procedure: RIGHT TOTAL HIP ARTHROPLASTY ANTERIOR APPROACH;  Surgeon: Ollen Gross, MD;  Location: [facility];  Service: Orthopedics;  Laterality: Right;    reports that he quit smoking about 45 years ago. His smoking use included cigarettes. He has a 5.00 pack-year smoking history. He has never used smokeless tobacco. He reports current alcohol use of about 4.0 standard drinks of alcohol per week. He reports that he does not use drugs. family history includes Atrial  fibrillation (age of onset: 78) in his brother; Atrial fibrillation (age of onset: 18) in his mother; CAD in his brother; CAD (age of onset: 36) in his paternal uncle; CAD (age of onset: 29) in his brother; Dementia in his father and mother; Hyperlipidemia in his father; Multiple sclerosis in his brother. Allergies  Allergen Reactions   Molds & Smuts Shortness Of Breath and Other (See Comments)   Quercus Robur Shortness Of Breath   [employer] Other (See Comments)    Review of Systems  Constitutional:  Negative for chills and fever.  HENT:  Positive for congestion.   Respiratory:  Positive for cough and sputum production.   Neurological:  Negative for headaches.      Objective:     BP 110/62 (BP Location: Left Arm, Patient Position: Sitting, Cuff Size: Normal)   Pulse 65   Temp 98.2 F (36.8 C) (Oral)   Ht 5\' 11"  (1.803 m)   Wt 181 lb 14.4 oz (82.5 kg)   SpO2 98%   BMI 25.37 kg/m    Physical Exam Vitals reviewed.  Constitutional:      General: He is not in acute distress.    Appearance: He is not ill-appearing.  Neck:     Thyroid: No thyromegaly.  Cardiovascular:     Rate and Rhythm: Normal rate and  regular rhythm.  Pulmonary:     Effort: Pulmonary effort is normal.     Breath sounds: Normal breath sounds. No wheezing or rales.  Musculoskeletal:     Cervical back: Neck supple.  Lymphadenopathy:     Cervical: No cervical adenopathy.  Neurological:     Mental Status: He is alert.      No results found for any visits on 01/18/23.    The 10-year ASCVD risk score (Arnett DK, et al., 2019) is: 12.7%    Assessment & Plan:   Acute bronchitis.  We explained that most bronchitis is viral.  He has had history of recurrent sinusitis in the past with prior sinus surgery.  We recommend plenty of fluids and observation.  If he develops any facial pain or any persistent or worsening symptoms start cefdinir 300 mg twice daily for 7 days  Evelena Peat, MD

## 2023-02-11 ENCOUNTER — Telehealth: Payer: Self-pay | Admitting: Family Medicine

## 2023-02-11 ENCOUNTER — Ambulatory Visit: Payer: Medicare PPO | Admitting: Adult Health

## 2023-02-11 ENCOUNTER — Encounter: Payer: Self-pay | Admitting: Adult Health

## 2023-02-11 VITALS — BP 110/70 | HR 76 | Temp 98.1°F | Ht 71.0 in | Wt 184.0 lb

## 2023-02-11 DIAGNOSIS — J029 Acute pharyngitis, unspecified: Secondary | ICD-10-CM

## 2023-02-11 DIAGNOSIS — J0101 Acute recurrent maxillary sinusitis: Secondary | ICD-10-CM

## 2023-02-11 LAB — POCT RAPID STREP A (OFFICE): Rapid Strep A Screen: NEGATIVE

## 2023-02-11 LAB — POC COVID19 BINAXNOW: SARS Coronavirus 2 Ag: NEGATIVE

## 2023-02-11 MED ORDER — DOXYCYCLINE HYCLATE 100 MG PO CAPS
100.0000 mg | ORAL_CAPSULE | Freq: Two times a day (BID) | ORAL | 0 refills | Status: DC
Start: 2023-02-11 — End: 2023-04-02

## 2023-02-11 NOTE — Progress Notes (Signed)
Subjective:    Patient ID: Edwin [name], male    DOB: May 16, 1956, 67 y.o.   MRN: [id number]  HPI  67 year old male who  has a past medical history of Allergy, Arthritis, Family history of adverse reaction to anesthesia, Nonobstructive atherosclerosis of coronary artery (2016), and Sinus infection.  He was seen by his provider roughly 3-1/2 weeks ago for an acute complaint of 6-day history of sore throat, nasal congestion, productive cough of green sputum.  Symptoms started after he picked up his daughter and granddaughter who had similar symptoms.  He did do a home COVID test at this time which was negative.  He was diagnosed with acute bronchitis thought to be viral.  Due to his history of recurrent sinusitis in the past with prior sinus surgery he was advised to start taking cefdinir 300 mg twice daily x 7 days for facial pain or any persistent or worsening symptoms.  Today he reports that he completed Cefdinir about 2 weeks ago and feels as though it worked well but he continued to have a mild sore throat.   Unfortunately, earlier this week his wife developed sinus infection and over the last three days he had developed symptoms of facial pain, green nasal drainage   Unfortunanly he reports " my whole house is sick". Over the last 3 days he has developed worsening facial pain, green nasal drainage, sore throat , PND, mild sinus congestion, and sore throat.   He has not had any fevers or chills.   Review of Systems See HPI   Past Medical History:  Diagnosis Date   Allergy    Arthritis    Family history of adverse reaction to anesthesia    pts father had back surgery 10 years ago developed dementia    Nonobstructive atherosclerosis of coronary artery 2016   cardiac CT   Sinus infection     Social History   Socioeconomic History   Marital status: Married    Spouse name: Not on file   Number of children: 2   Years of education: Not on file   Highest education level:  Doctorate  Occupational History   Occupation: Professor of Manufacturing systems engineer  Tobacco Use   Smoking status: Former    Current packs/day: 0.00    Average packs/day: 1 pack/day for 5.0 years (5.0 ttl pk-yrs)    Types: Cigarettes    Start date: 10/01/1972    Quit date: 10/01/1977    Years since quitting: 45.3   Smokeless tobacco: Never  Vaping Use   Vaping status: Never Used  Substance and Sexual Activity   Alcohol use: Yes    Alcohol/week: 4.0 standard drinks of alcohol    Types: 4 Standard drinks or equivalent per week    Comment: beer socially (2 beers per day on average)   Drug use: No   Sexual activity: Not on file  Other Topics Concern   Not on file  Social History Narrative   Lives in [place] with spouse.  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[employer])   Social Determinants of Health   Financial Resource Strain: Low Risk  (03/30/2022)   Overall Financial Resource Strain (CARDIA)    Difficulty of Paying Living Expenses: Not hard at all  Food Insecurity: No Food Insecurity (03/30/2022)   Hunger Vital Sign    Worried About Running Out of Food in the Last Year: Never true  Ran Out of Food in the Last Year: Never true  Transportation Needs: No Transportation Needs (03/30/2022)   PRAPARE - Administrator, Civil Service (Medical): No    Lack of Transportation (Non-Medical): No  Physical Activity: Sufficiently Active (03/30/2022)   Exercise Vital Sign    Days of Exercise per Week: 5 days    Minutes of Exercise per Session: 60 min  Stress: No Stress Concern Present (03/30/2022)   [employer] of Occupational Health - Occupational Stress Questionnaire    Feeling of Stress : Not at all  Social Connections: Socially Integrated (03/30/2022)   Social Connection and Isolation Panel [NHANES]    Frequency of Communication with Friends and Family: More than three times a week    Frequency of  Social Gatherings with Friends and Family: More than three times a week    Attends Religious Services: More than 4 times per year    Active Member of Clubs or Organizations: Yes    Attends Banker Meetings: More than 4 times per year    Marital Status: Married  Catering manager Violence: Not At Risk (03/30/2022)   Humiliation, Afraid, Rape, and Kick questionnaire    Fear of Current or Ex-Partner: No    Emotionally Abused: No    Physically Abused: No    Sexually Abused: No    Past Surgical History:  Procedure Laterality Date   KNEE SURGERY  2009   microfracture surgery; left    OTHER SURGICAL HISTORY  12/02/2016   sinus    ROOT CANAL     TONSILLECTOMY     TOTAL HIP ARTHROPLASTY Right 12/02/2015   Procedure: RIGHT TOTAL HIP ARTHROPLASTY ANTERIOR APPROACH;  Surgeon: Ollen Gross, MD;  Location: [facility];  Service: Orthopedics;  Laterality: Right;    Family History  Problem Relation Age of Onset   Dementia Mother    Atrial fibrillation Mother 17   Hyperlipidemia Father    Dementia Father    Multiple sclerosis Brother    CAD Brother 72       3 stents   Atrial fibrillation Brother 75   CAD Brother        70% stenosis in on coronary   CAD Paternal Uncle 52       Died of MI    Allergies  Allergen Reactions   Molds & Smuts Shortness Of Breath and Other (See Comments)   Quercus Robur Shortness Of Breath   [place] [employer] [place] Other (See Comments)    Current Outpatient Medications on File Prior to Visit  Medication Sig Dispense Refill   budesonide (PULMICORT) 0.5 MG/2ML nebulizer solution Take 0.5 mg by nebulization as needed. Use as directed     ELIQUIS 5 MG TABS tablet Take 5 mg by mouth 2 (two) times daily.     fluticasone (FLONASE) 50 MCG/ACT nasal spray SHAKE LIQUID AND USE 1 SPRAY IN EACH NOSTRIL AT BEDTIME 16 g 2   metoprolol succinate (TOPROL-XL) 25 MG 24 hr tablet Take 25 mg by mouth 2 (two) times daily.     sildenafil (REVATIO) 20 MG tablet TAKE 2 TO 5  TABLETS BY MOUTH ONE HOUR PRIOR TO SEXUAL ACTIVITY AS NEEDED 50 tablet 3   No current facility-administered medications on file prior to visit.    BP 110/70   Pulse 76   Temp 98.1 F (36.7 C) (Oral)   Ht 5\' 11"  (1.803 m)   Wt 184 lb (83.5 kg)   SpO2 97%   BMI 25.66  kg/m       Objective:   Physical Exam Vitals and nursing note reviewed.  Constitutional:      Appearance: Normal appearance.  HENT:     Right Ear: Hearing, tympanic membrane, ear canal and external ear normal.     Left Ear: Hearing, tympanic membrane, ear canal and external ear normal.     Nose: Congestion and rhinorrhea present. Rhinorrhea is purulent.     Right Turbinates: Enlarged and swollen.     Left Turbinates: Enlarged and swollen.     Right Sinus: No maxillary sinus tenderness or frontal sinus tenderness.     Left Sinus: Maxillary sinus tenderness present. No frontal sinus tenderness.     Mouth/Throat:     Lips: Pink.     Mouth: Mucous membranes are moist.     Pharynx: No posterior oropharyngeal erythema.     Tonsils: No tonsillar exudate or tonsillar abscesses.     Comments: + PND   Cardiovascular:     Rate and Rhythm: Normal rate and regular rhythm.     Pulses: Normal pulses.     Heart sounds: Normal heart sounds.  Pulmonary:     Effort: Pulmonary effort is normal.     Breath sounds: Normal breath sounds.  Skin:    General: Skin is warm and dry.     Capillary Refill: Capillary refill takes less than 2 seconds.  Neurological:     General: No focal deficit present.     Mental Status: He is alert and oriented to person, place, and time.  Psychiatric:        Mood and Affect: Mood normal.        Behavior: Behavior normal.        Thought Content: Thought content normal.        Judgment: Judgment normal.        Assessment & Plan:  1. Acute recurrent maxillary sinusitis - Will treat with Doxycyline for recurrent sinusitis. Advised follow up if not resolved in one week  - POC COVID-19  BinaxNow- Negative  - doxycycline (VIBRAMYCIN) 100 MG capsule; Take 1 capsule (100 mg total) by mouth 2 (two) times daily.  Dispense: 14 capsule; Refill: 0  2. Sore throat - Likely from PND - POCT rapid strep A- negative

## 2023-02-11 NOTE — Telephone Encounter (Signed)
Please advise 

## 2023-02-11 NOTE — Telephone Encounter (Addendum)
Pt was just seen by NP. Pt is requesting Rx for Paxlovid.

## 2023-02-11 NOTE — Telephone Encounter (Signed)
Patient notified of update  and verbalized understanding. 

## 2023-02-25 ENCOUNTER — Encounter (INDEPENDENT_AMBULATORY_CARE_PROVIDER_SITE_OTHER): Payer: Self-pay

## 2023-04-02 ENCOUNTER — Ambulatory Visit (INDEPENDENT_AMBULATORY_CARE_PROVIDER_SITE_OTHER): Payer: Medicare PPO

## 2023-04-02 VITALS — Ht 71.0 in | Wt 179.0 lb

## 2023-04-02 DIAGNOSIS — Z0000 Encounter for general adult medical examination without abnormal findings: Secondary | ICD-10-CM

## 2023-04-02 NOTE — Progress Notes (Signed)
Subjective:   Edwin [name] is a 67 y.o. male who presents for Medicare Annual/Subsequent preventive examination.  Visit Complete: Virtual  I connected with  Edwin [name] on 04/02/23 by a audio enabled telemedicine application and verified that I am speaking with the correct person using two identifiers.  Patient Location: Home  Provider Location: Home Office  I discussed the limitations of evaluation and management by telemedicine. The patient expressed understanding and agreed to proceed.  Patient Medicare AWV questionnaire was completed by the patient on 03/29/23; I have confirmed that all information answered by patient is correct and no changes since this date.  Cardiac Risk Factors include: advanced age (>65men, >55 women);male gender  Vital Signs: Unable to obtain new vitals due to this being a telehealth visit.      Objective:    Today's Vitals   04/02/23 1050  Weight: 179 lb (81.2 kg)  Height: 5\' 11"  (1.803 m)   Body mass index is 24.97 kg/m.     04/02/2023   10:57 AM 03/30/2022   10:52 AM 12/02/2015   11:17 AM 11/20/2015    1:38 PM 12/24/2014    2:13 PM 10/09/2014    2:03 PM  Advanced Directives  Does Patient Have a Medical Advance Directive? Yes Yes Yes No No No  Type of Estate agent of [place];Living will Healthcare Power of [place];Living will Healthcare Power of [place];Living will Healthcare Power of [place];Living will    Does patient want to make changes to medical advance directive?    No - Patient declined    Copy of Healthcare Power of Attorney in Chart? No - copy requested No - copy requested No - copy requested No - copy requested    Would patient like information on creating a medical advance directive?     No - patient declined information No - patient declined information    Current Medications (verified) Outpatient Encounter Medications as of 04/02/2023  Medication Sig   budesonide (PULMICORT) 0.5 MG/2ML  nebulizer solution Take 0.5 mg by nebulization as needed. Use as directed   ELIQUIS 5 MG TABS tablet Take 5 mg by mouth 2 (two) times daily.   fluticasone (FLONASE) 50 MCG/ACT nasal spray SHAKE LIQUID AND USE 1 SPRAY IN EACH NOSTRIL AT BEDTIME   metoprolol succinate (TOPROL-XL) 25 MG 24 hr tablet Take 25 mg by mouth 2 (two) times daily.   sildenafil (REVATIO) 20 MG tablet TAKE 2 TO 5 TABLETS BY MOUTH ONE HOUR PRIOR TO SEXUAL ACTIVITY AS NEEDED   [DISCONTINUED] doxycycline (VIBRAMYCIN) 100 MG capsule Take 1 capsule (100 mg total) by mouth 2 (two) times daily.   No facility-administered encounter medications on file as of 04/02/2023.    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/02/2015   Procedure: RIGHT TOTAL HIP ARTHROPLASTY ANTERIOR APPROACH;  Surgeon: Ollen Gross, MD;  Location: [facility];  Service: Orthopedics;  Laterality: Right;   Family History  Problem Relation Age of Onset   Dementia Mother    Atrial fibrillation Mother 26   Hyperlipidemia Father    Dementia Father    Multiple sclerosis Brother    CAD Brother 51       3 stents   Atrial fibrillation Brother 34   CAD Brother        70% stenosis in on coronary   CAD Paternal Uncle 73       Died of MI   Social History   Socioeconomic History   Marital status: Married    Spouse name: Not on file   Number of children: 2   Years of education: Not on file   Highest education level: Doctorate   Occupational History   Occupation: Professor of Manufacturing systems engineer  Tobacco Use   Smoking status: Former    Current packs/day: 0.00    Average packs/day: 1 pack/day for 5.0 years (5.0 ttl pk-yrs)    Types: Cigarettes    Start date: 10/01/1972    Quit date: 10/01/1977    Years since quitting: 45.5   Smokeless tobacco: Never  Vaping Use   Vaping status: Never Used  Substance and Sexual Activity   Alcohol use: Yes    Alcohol/week: 4.0 standard drinks of alcohol    Types: 4 Standard drinks or equivalent per week    Comment: beer socially (2 beers per day on average)   Drug use: No   Sexual activity: Not on file  Other Topics Concern   Not on file  Social History Narrative   Lives in [place] with spouse.  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[employer])   Social Determinants of Health   Financial Resource Strain: Low Risk  (03/29/2023)   Overall Financial Resource Strain (CARDIA)    Difficulty of Paying Living Expenses: Not hard at all  Food Insecurity: No Food Insecurity (03/29/2023)   Hunger Vital Sign    Worried About Running Out of Food in the Last Year: Never true    Ran Out of Food in the Last Year: Never true  Transportation Needs: No Transportation Needs (03/29/2023)   PRAPARE - Administrator, Civil Service (Medical): No    Lack of Transportation (Non-Medical): No  Physical Activity: Sufficiently Active (03/29/2023)   Exercise Vital Sign    Days of Exercise per Week: 4 days    Minutes of Exercise per Session: 60 min  Stress: No Stress Concern Present (03/29/2023)   [employer] of Occupational Health - Occupational Stress Questionnaire    Feeling of Stress : Not at all  Social Connections: Unknown (03/29/2023)   Social Connection and Isolation Panel [NHANES]    Frequency of Communication with Friends and Family: More than three times a week    Frequency of Social Gatherings with  Friends and Family: Three times a week    Attends Religious Services: Not on file    Active Member of Clubs or Organizations: Yes    Attends Banker Meetings: More than 4 times per year    Marital Status: Married    Tobacco Counseling Counseling given: Not Answered   Clinical Intake:  Pre-visit preparation completed: Yes  Pain : No/denies pain     BMI - recorded: 24.97 Nutritional Status: BMI of 19-24  Normal Nutritional Risks: None Diabetes: No  How often do you  need to have someone help you when you read instructions, pamphlets, or other written materials from your doctor or pharmacy?: 1 - Never  Interpreter Needed?: No  Information entered by :: Theresa Mulligan LPN   Activities of Daily Living    03/29/2023    9:22 AM  In your present state of health, do you have any difficulty performing the following activities:  Hearing? 1  Comment Wears hearing aids  Vision? 0  Difficulty concentrating or making decisions? 0  Walking or climbing stairs? 0  Dressing or bathing? 0  Doing errands, shopping? 0  Preparing Food and eating ? N  Using the Toilet? N  In the past six months, have you accidently leaked urine? N  Do you have problems with loss of bowel control? N  Managing your Medications? N  Managing your Finances? N  Housekeeping or managing your Housekeeping? N    Patient Care Team: Kristian Covey, MD as PCP - General  Indicate any recent Medical Services you may have received from other than [facility] providers in the past year (date may be approximate).     Assessment:   This is a routine wellness examination for [place].  Hearing/Vision screen Hearing Screening - Comments:: Wears hearing aids Vision Screening - Comments:: Wears reading glasses - up to date with routine eye exams with  Dr Emily Filbert   Goals Addressed               This Visit's Progress     Lose 5 lbs (pt-stated)         Depression Screen    04/02/2023   11:02 AM  12/02/2022   10:45 AM 08/21/2022    4:55 PM 03/30/2022   10:49 AM 01/02/2022    9:11 AM 10/27/2019   11:07 AM 05/04/2018    7:38 AM  PHQ 2/9 Scores  PHQ - 2 Score 0 0 0 0 0 0 0  PHQ- 9 Score  0         Fall Risk    03/29/2023    9:22 AM 12/02/2022   10:45 AM 08/21/2022    4:55 PM 03/29/2022   11:49 AM 03/05/2022    9:11 AM  Fall Risk   Falls in the past year? 0 0 0 0 0  Number falls in past yr: 0 0 0 0   Injury with Fall? 0 0 0 0   Risk for fall due to :  No Fall Risks No Fall Risks    Follow up  Falls evaluation completed Falls evaluation completed      MEDICARE RISK AT HOME: Medicare Risk at Home Any stairs in or around the home?: Yes If so, are there any without handrails?: No Home free of loose throw rugs in walkways, pet beds, electrical cords, etc?: Yes Adequate lighting in your home to reduce risk of falls?: Yes Life alert?: No Use of a cane, walker or w/c?: No Grab bars in the bathroom?: No Shower chair or bench in shower?: No Elevated toilet seat or a handicapped toilet?: No  TIMED UP AND GO:  Was the test performed?  No    Cognitive Function:        04/02/2023   10:57 AM 03/30/2022   10:53 AM  6CIT Screen  What Year? 0 points 0 points  What month? 0 points 0 points  What time? 0 points 0 points  Count back from 20 0 points 0 points  Months in reverse 0 points 0 points  Repeat phrase 0 points 0 points  Total Score 0 points 0 points    Immunizations Immunization History  Administered Date(s) Administered   Hepatitis A 07/13/2006, 12/12/2006   Influenza,inj,Quad PF,6+ Mos 04/26/2014   Influenza,inj,quad, With Preservative 03/15/2017   Influenza-Unspecified 04/13/2015, 03/18/2017, 05/07/2021   PFIZER(Purple Top)SARS-COV-2 Vaccination 08/22/2019, 09/18/2019, 03/25/2020, 11/01/2020   Td 07/14/2003   Tdap 10/05/2012   Zoster Recombinant(Shingrix) 12/31/2020, 05/27/2021    TDAP status: Due, Education has been provided regarding the importance of this  vaccine. Advised may receive this vaccine at local pharmacy or Health Dept. Aware to provide a copy of the vaccination record if obtained from local pharmacy or Health Dept. Verbalized acceptance and understanding.  Flu Vaccine status: Due, Education has been provided regarding the importance of this vaccine. Advised may receive this vaccine at local pharmacy or Health Dept. Aware to provide a copy of the vaccination record if obtained from local pharmacy or Health Dept. Verbalized acceptance and understanding.  Pneumococcal vaccine status: Due, Education has been provided regarding the importance of this vaccine. Advised may receive this vaccine at local pharmacy or Health Dept. Aware to provide a copy of the vaccination record if obtained from local pharmacy or Health Dept. Verbalized acceptance and understanding.  Covid-19 vaccine status: Declined, Education has been provided regarding the importance of this vaccine but patient still declined. Advised may receive this vaccine at local pharmacy or Health Dept.or vaccine clinic. Aware to provide a copy of the vaccination record if obtained from local pharmacy or Health Dept. Verbalized acceptance and understanding.  Qualifies for Shingles Vaccine? Yes   Zostavax completed Yes   Shingrix Completed?: Yes  Screening Tests Health Maintenance  Topic Date Due   Pneumonia Vaccine 72+ Years old (1 of 1 - PCV) Never done   DTaP/Tdap/Td (3 - Td or Tdap) 10/06/2022   INFLUENZA VACCINE  02/11/2023   COVID-19 Vaccine (5 - 2023-24 season) 03/14/2023   Hepatitis C Screening  04/28/2037 (Originally 01/29/1974)   Medicare Annual Wellness (AWV)  04/01/2024   Colonoscopy  04/30/2026   Zoster Vaccines- Shingrix  Completed   HPV VACCINES  Aged Out    Health Maintenance  Health Maintenance Due  Topic Date Due   Pneumonia Vaccine 77+ Years old (1 of 1 - PCV) Never done   DTaP/Tdap/Td (3 - Td or Tdap) 10/06/2022   INFLUENZA VACCINE  02/11/2023   COVID-19  Vaccine (5 - 2023-24 season) 03/14/2023    Colorectal cancer screening: Type of screening: Colonoscopy. Completed 04/30/16. Repeat every 10 years    Additional Screening:  Hepatitis C Screening: does qualify; Completed 04/28/17  Vision Screening: Recommended annual ophthalmology exams for early detection of glaucoma and other disorders of the eye. Is the patient up to date with their annual eye exam?  Yes  Who is the provider or what is the name of the office in which the patient attends annual eye exams? Dr Emily Filbert If pt is not established with a provider, would they like to be referred to a provider to establish care? No .   Dental Screening: Recommended annual dental exams for proper oral hygiene   Community Resource Referral / Chronic Care Management:  CRR required this visit?  No   CCM required this visit?  No     Plan:     I have personally reviewed and noted the following in the patient's chart:   Medical and social history Use of alcohol, tobacco or illicit drugs  Current medications and supplements including opioid prescriptions.  Patient is not currently taking opioid prescriptions. Functional ability and status Nutritional status Physical activity Advanced directives List of other physicians Hospitalizations, surgeries, and ER visits in previous 12 months Vitals Screenings to include cognitive, depression, and falls Referrals and appointments  In addition, I have reviewed and discussed with patient certain preventive protocols, quality metrics, and best practice recommendations. A written personalized care plan for preventive services as well as general preventive health recommendations were provided to patient.     Tillie Rung, LPN   1/61/0960   After Visit Summary: (MyChart) Due to this being a telephonic visit, the after visit summary with patients personalized plan was offered to patient via MyChart   Nurse Notes: None

## 2023-04-02 NOTE — Patient Instructions (Addendum)
Edwin [name] , Thank you for taking time to come for your Medicare Wellness Visit. I appreciate your ongoing commitment to your health goals. Please review the following plan we discussed and let me know if I can assist you in the future.   Referrals/Orders/Follow-Ups/Clinician Recommendations:   This is a list of the screening recommended for you and due dates:  Health Maintenance  Topic Date Due   Pneumonia Vaccine (1 of 1 - PCV) Never done   DTaP/Tdap/Td vaccine (3 - Td or Tdap) 10/06/2022   Flu Shot  02/11/2023   COVID-19 Vaccine (5 - 2023-24 season) 03/14/2023   Hepatitis C Screening  04/28/2037*   Medicare Annual Wellness Visit  04/01/2024   Colon Cancer Screening  04/30/2026   Zoster (Shingles) Vaccine  Completed   HPV Vaccine  Aged Out  *Topic was postponed. The date shown is not the original due date.    Advanced directives: (Copy Requested) Please bring a copy of your health care power of attorney and living will to the office to be added to your chart at your convenience.  Next Medicare Annual Wellness Visit scheduled for next year: Yes

## 2023-04-07 DIAGNOSIS — I480 Paroxysmal atrial fibrillation: Secondary | ICD-10-CM | POA: Diagnosis not present

## 2023-04-19 ENCOUNTER — Telehealth: Payer: Self-pay | Admitting: Family Medicine

## 2023-04-20 ENCOUNTER — Ambulatory Visit: Payer: Medicare PPO | Admitting: Adult Health

## 2023-04-20 ENCOUNTER — Encounter: Payer: Self-pay | Admitting: Adult Health

## 2023-04-20 VITALS — BP 120/70 | HR 57 | Temp 97.8°F | Ht 71.0 in | Wt 186.0 lb

## 2023-04-20 DIAGNOSIS — M79675 Pain in left toe(s): Secondary | ICD-10-CM | POA: Diagnosis not present

## 2023-04-20 NOTE — Progress Notes (Signed)
Subjective:    Patient ID: Edwin [name], male    DOB: 06/05/1956, 67 y.o.   MRN: [id number]  HPI 67 year old male who  has a past medical history of Allergy, Arthritis, Family history of adverse reaction to anesthesia, Nonobstructive atherosclerosis of coronary artery (2016), and Sinus infection.  Presents to the office today for concern of infection on his left great toe.  First noticed some discoloration towards the nailbed about 2 weeks ago with discoloration to the toenail.  Feels that over the last day or 2 redness has gotten a little bit better but continues to have some tenderness with palpation.  He has not noticed any drainage or discharge from the nail bed.    Review of Systems See HPI   Past Medical History:  Diagnosis Date   Allergy    Arthritis    Family history of adverse reaction to anesthesia    pts father had back surgery 10 years ago developed dementia    Nonobstructive atherosclerosis of coronary artery 2016   cardiac CT   Sinus infection     Social History   Socioeconomic History   Marital status: Married    Spouse name: Not on file   Number of children: 2   Years of education: Not on file   Highest education level: Doctorate  Occupational History   Occupation: Professor of Manufacturing systems engineer  Tobacco Use   Smoking status: Former    Current packs/day: 0.00    Average packs/day: 1 pack/day for 5.0 years (5.0 ttl pk-yrs)    Types: Cigarettes    Start date: 10/01/1972    Quit date: 10/01/1977    Years since quitting: 45.5   Smokeless tobacco: Never  Vaping Use   Vaping status: Never Used  Substance and Sexual Activity   Alcohol use: Yes    Alcohol/week: 4.0 standard drinks of alcohol    Types: 4 Standard drinks or equivalent per week    Comment: beer socially (2 beers per day on average)   Drug use: No   Sexual activity: Not on file  Other Topics Concern   Not on file  Social History Narrative   Lives in [place] with spouse.  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[employer])   Social Determinants of Health   Financial Resource Strain: Low Risk  (04/19/2023)   Overall Financial Resource Strain (CARDIA)    Difficulty of Paying Living Expenses: Not hard at all  Food Insecurity: No Food Insecurity (04/19/2023)   Hunger Vital Sign    Worried About Running Out of Food in the Last Year: Never true    Ran Out of Food in the Last Year: Never true  Transportation Needs: No Transportation Needs (04/19/2023)   PRAPARE - Administrator, Civil Service (Medical): No    Lack of Transportation (Non-Medical): No  Physical Activity: Sufficiently Active (04/19/2023)   Exercise Vital Sign    Days of Exercise per Week: 5 days    Minutes of Exercise per Session: 50 min  Stress: No Stress Concern Present (04/19/2023)   [employer] of Occupational Health - Occupational Stress Questionnaire    Feeling of Stress : Not at all  Social Connections: Socially Integrated (04/19/2023)   Social Connection and Isolation Panel [NHANES]    Frequency of Communication with Friends and Family: More than three times a week    Frequency of Social Gatherings with Friends and  Family: Three times a week    Attends Religious Services: 1 to 4 times per year    Active Member of Clubs or Organizations: Yes    Attends Banker Meetings: More than 4 times per year    Marital Status: Married  Catering manager Violence: Not At Risk (04/02/2023)   Humiliation, Afraid, Rape, and Kick questionnaire    Fear of Current or Ex-Partner: No    Emotionally Abused: No    Physically Abused: No    Sexually Abused: No    Past Surgical History:  Procedure Laterality Date   KNEE SURGERY  2009   microfracture surgery; left    OTHER SURGICAL HISTORY  12/02/2016   sinus    ROOT CANAL     TONSILLECTOMY     TOTAL HIP ARTHROPLASTY Right 12/02/2015   Procedure: RIGHT TOTAL HIP  ARTHROPLASTY ANTERIOR APPROACH;  Surgeon: Ollen Gross, MD;  Location: [facility];  Service: Orthopedics;  Laterality: Right;    Family History  Problem Relation Age of Onset   Dementia Mother    Atrial fibrillation Mother 34   Hyperlipidemia Father    Dementia Father    Multiple sclerosis Brother    CAD Brother 52       3 stents   Atrial fibrillation Brother 56   CAD Brother        70% stenosis in on coronary   CAD Paternal Uncle 54       Died of MI    Allergies  Allergen Reactions   Molds & Smuts Shortness Of Breath and Other (See Comments)   Quercus Robur Shortness Of Breath   [place] Other (See Comments)    Current Outpatient Medications on File Prior to Visit  Medication Sig Dispense Refill   budesonide (PULMICORT) 0.5 MG/2ML nebulizer solution Take 0.5 mg by nebulization as needed. Use as directed     ELIQUIS 5 MG TABS tablet Take 5 mg by mouth 2 (two) times daily.     fluticasone (FLONASE) 50 MCG/ACT nasal spray SHAKE LIQUID AND USE 1 SPRAY IN EACH NOSTRIL AT BEDTIME 16 g 2   metoprolol succinate (TOPROL-XL) 25 MG 24 hr tablet Take 25 mg by mouth 2 (two) times daily.     sildenafil (REVATIO) 20 MG tablet TAKE 2 TO 5 TABLETS BY MOUTH ONE HOUR PRIOR TO SEXUAL ACTIVITY AS NEEDED 50 tablet 3   No current facility-administered medications on file prior to visit.    BP 120/70   Pulse (!) 57   Temp 97.8 F (36.6 C) (Oral)   Ht 5\' 11"  (1.803 m)   Wt 186 lb (84.4 kg)   SpO2 98%   BMI 25.94 kg/m       Objective:   Physical Exam Vitals and nursing note reviewed.  Constitutional:      Appearance: Normal appearance.  Feet:     Right foot:     Skin integrity: Erythema present.     Toenail Condition: Right toenails are ingrown. Fungal disease present.    Comments: Does have some localized erythema around the nailbed on his left great toe.  He also has some suspected toenail fungus in the beginning of an ingrown toenail. Skin:    General: Skin is warm and  dry.     Capillary Refill: Capillary refill takes less than 2 seconds.  Neurological:     General: No focal deficit present.     Mental Status: He is oriented to person, place, and time.  Psychiatric:  Mood and Affect: Mood normal.        Behavior: Behavior normal.        Thought Content: Thought content normal.        Judgment: Judgment normal.       Assessment & Plan:  1. Pain of toe of left foot -? Infection  versus irritation from toenail itself.  He is going to make an appointment with podiatry for further evaluation. -Hold off on antibiotics at this point in time since there is no active drainage.  Shirline Frees, NP  Time spent with patient today was 31 minutes which consisted of chart review, discussing left toe pain, fungal disease and ingrown toenails, , work up, treatment answering questions and documentation.

## 2023-04-26 ENCOUNTER — Encounter: Payer: Self-pay | Admitting: Podiatry

## 2023-04-26 ENCOUNTER — Ambulatory Visit: Payer: Medicare PPO | Admitting: Podiatry

## 2023-04-26 VITALS — BP 127/71 | HR 64 | Resp 18 | Ht 71.0 in | Wt 180.0 lb

## 2023-04-26 DIAGNOSIS — B351 Tinea unguium: Secondary | ICD-10-CM

## 2023-04-26 MED ORDER — GENTAMICIN SULFATE 0.1 % EX OINT: 1.0000 | TOPICAL_OINTMENT | Freq: Three times a day (TID) | CUTANEOUS | 0 refills | Status: DC

## 2023-04-26 NOTE — Progress Notes (Signed)
  Subjective:  Patient ID: Edwin [name], male    DOB: July 05, 1956,  MRN: [id number]  Chief Complaint  Patient presents with   Ingrown Toenail    PATIENT STATES TAT 2 WEEKS AGO HIS HALLUX STARTED TO GET A FUNGUS IN IT AND SORE TO THE TOUCH.     Discussed the use of AI scribe software for clinical note transcription with the patient, who gave verbal consent to proceed.  History of Present Illness          67 year old male with a past medical history of toenail fungus and atrial fibrillation, presents with a chief complaint of toenail problems. He reports a history of toenail removal due to fungus, but has not had issues with his left foot until recently. He noticed a discoloration of the nail and developed a painful, red area underneath the nail about three weeks ago. The redness and pain have since improved, but the nail's appearance remains concerning. The patient denies any drainage or fluid from the nail. He has not been on any medications for this issue, but has previously taken oral medications for toenail fungus, which he reports were not helpful. The patient also mentions a history of atrial fibrillation, which has been managed with an ablation procedure and medications including Eliquis and metoprolol.   Objective:    Physical Exam          General: AAO x3, NAD  Dermatological: Hallux toenails dystrophic with yellow, brown discoloration and slight green discoloration underneath the toenail but the nail is firmly adhered to the underlying nailbed.  There is no significant surrounding erythema, ascending cellulitis there is no drainage or pus.  There is no fluctuance or crepitation.  There is no malodor.  Vascular: Dorsalis Pedis artery and Posterior Tibial artery pedal pulses are 2/4 bilateral with immedate capillary fill time.  There is no pain with calf compression, swelling, warmth, erythema.   Neruologic: Grossly intact via light touch bilateral.   Musculoskeletal: No  gross boney pedal deformities bilateral. No pain, crepitus, or limitation noted with foot and ankle range of motion bilateral. Muscular strength 5/5 in all groups tested bilateral.  Gait: Unassisted, Nonantalgic.   No images are attached to the encounter.    Results          Assessment:  No diagnosis found.   Plan:  Patient was evaluated and treated and all questions answered.  Assessment and Plan          Onychomycosis and possible bacterial infection Left big toe with discoloration and recent history of redness and pain. No current drainage. Previous history of nail removal on right foot due to fungal infection. -Trim and clean the affected nail. -Start topical gentamicin for possible bacterial infection. -Start compounded antifungal medication with urea and tea tree oil for fungal infection.  We discussed oral, topical as well as alternative treatments.  Sent compound cream to [facility] for nail fungus. -Soak foot in Epsom salts once daily. -Consider use of the antifungal medication on other nails with previous fungal infection. -Follow-up as needed, and contact if symptoms worsen.   Return if symptoms worsen or fail to improve.    Vivi Barrack DPM

## 2023-04-26 NOTE — Patient Instructions (Addendum)
I have ordered a medication for you that will come from [facility] in [place]. They should be calling you to verify insurance and will mail the medication to you. If you live close by then you can go by their pharmacy to pick up the medication. Their phone number is [phone]. If you do not hear from them in the next few days, please give [place] a call at [phone].    --  Soak Instructions    THE DAY AFTER THE PROCEDURE  Place 1/4 cup of epsom salts in a quart of warm tap water.  Submerge your foot or feet with outer bandage intact for the initial soak; this will allow the bandage to become moist and wet for easy lift off.  Once you remove your bandage, continue to soak in the solution for 20 minutes.  This soak should be done twice a day.  Next, remove your foot or feet from solution, blot dry the affected area and cover.  You may use a band aid large enough to cover the area or use gauze and tape.  Apply other medications to the area as directed by the doctor such as polysporin neosporin.  IF YOUR SKIN BECOMES IRRITATED WHILE USING THESE INSTRUCTIONS, IT IS OKAY TO SWITCH TO  WHITE VINEGAR AND WATER. Or you may use antibacterial soap and water to keep the toe clean  Monitor for any signs/symptoms of infection. Call the office immediately if any occur or go directly to the emergency room. Call with any questions/concerns.

## 2023-04-28 ENCOUNTER — Encounter: Payer: Self-pay | Admitting: Podiatry

## 2023-05-04 ENCOUNTER — Encounter: Payer: Self-pay | Admitting: Family Medicine

## 2023-05-04 ENCOUNTER — Ambulatory Visit: Payer: Medicare PPO | Admitting: Family Medicine

## 2023-05-04 VITALS — BP 120/72 | HR 59 | Temp 97.4°F | Ht 71.0 in | Wt 182.8 lb

## 2023-05-04 DIAGNOSIS — I480 Paroxysmal atrial fibrillation: Secondary | ICD-10-CM

## 2023-05-04 DIAGNOSIS — Z0000 Encounter for general adult medical examination without abnormal findings: Secondary | ICD-10-CM | POA: Diagnosis not present

## 2023-05-04 DIAGNOSIS — Z125 Encounter for screening for malignant neoplasm of prostate: Secondary | ICD-10-CM

## 2023-05-04 DIAGNOSIS — Z79899 Other long term (current) drug therapy: Secondary | ICD-10-CM

## 2023-05-04 LAB — CBC WITH DIFFERENTIAL/PLATELET
Basophils Absolute: 0 10*3/uL (ref 0.0–0.1)
Basophils Relative: 0.3 % (ref 0.0–3.0)
Eosinophils Absolute: 0.3 10*3/uL (ref 0.0–0.7)
Eosinophils Relative: 5.3 % — ABNORMAL HIGH (ref 0.0–5.0)
HCT: 47.1 % (ref 39.0–52.0)
Hemoglobin: 15.8 g/dL (ref 13.0–17.0)
Lymphocytes Relative: 26.3 % (ref 12.0–46.0)
Lymphs Abs: 1.3 10*3/uL (ref 0.7–4.0)
MCHC: 33.6 g/dL (ref 30.0–36.0)
MCV: 99.4 fL (ref 78.0–100.0)
Monocytes Absolute: 0.3 10*3/uL (ref 0.1–1.0)
Monocytes Relative: 7.2 % (ref 3.0–12.0)
Neutro Abs: 3 10*3/uL (ref 1.4–7.7)
Neutrophils Relative %: 60.9 % (ref 43.0–77.0)
Platelets: 188 10*3/uL (ref 150.0–400.0)
RBC: 4.74 Mil/uL (ref 4.22–5.81)
RDW: 13.4 % (ref 11.5–15.5)
WBC: 4.9 10*3/uL (ref 4.0–10.5)

## 2023-05-04 LAB — BASIC METABOLIC PANEL
BUN: 15 mg/dL (ref 6–23)
CO2: 29 meq/L (ref 19–32)
Calcium: 9.5 mg/dL (ref 8.4–10.5)
Chloride: 105 meq/L (ref 96–112)
Creatinine, Ser: 1 mg/dL (ref 0.40–1.50)
GFR: 77.47 mL/min (ref >60.00)
Glucose, Bld: 93 mg/dL (ref 70–99)
Potassium: 4.3 meq/L (ref 3.5–5.1)
Sodium: 143 meq/L (ref 135–145)

## 2023-05-04 LAB — HEPATIC FUNCTION PANEL
ALT: 20 U/L (ref 0–53)
AST: 25 U/L (ref 0–37)
Albumin: 4.5 g/dL (ref 3.5–5.2)
Alkaline Phosphatase: 75 U/L (ref 39–117)
Bilirubin, Direct: 0.2 mg/dL (ref 0.0–0.3)
Total Bilirubin: 0.9 mg/dL (ref 0.2–1.2)
Total Protein: 6.6 g/dL (ref 6.0–8.3)

## 2023-05-04 LAB — LIPID PANEL
Cholesterol: 228 mg/dL — ABNORMAL HIGH (ref 0–200)
HDL: 49.8 mg/dL (ref >39.00)
LDL Cholesterol: 154 mg/dL — ABNORMAL HIGH (ref 0–99)
NonHDL: 178.03
Total CHOL/HDL Ratio: 5
Triglycerides: 119 mg/dL (ref 0.0–149.0)
VLDL: 23.8 mg/dL (ref 0.0–40.0)

## 2023-05-04 LAB — PSA, MEDICARE: PSA: 2 ng/mL (ref 0.10–4.00)

## 2023-05-04 NOTE — Progress Notes (Signed)
Established Patient Office Visit  Subjective   Patient ID: Edwin [name], male    DOB: 29-May-1956  Age: 67 y.o. MRN: [id number]  Chief Complaint  Patient presents with   Annual Exam    HPI   Develle is seen today for physical exam.  He has history of atrial fibrillation, osteoarthritis, recurrent sinusitis.  Doing very well at this time.  He retired over a year ago.  Has been exercising diligently.  He had second cardiac ablation procedure done May 10 at [facility] and that has gone well.  No evidence for recurrent A-fib since then.  Is still maintained on metoprolol and Eliquis.  He desires to come off these but his cardiologist did not recommend this.  Denies any recent chest pains or dizziness.  Health maintenance reviewed:  Health Maintenance  Topic Date Due   Pneumonia Vaccine 61+ Years old (1 of 1 - PCV) Never done   DTaP/Tdap/Td (3 - Td or Tdap) 10/06/2022   Hepatitis C Screening  04/28/2037 (Originally 01/29/1974)   COVID-19 Vaccine (6 - 2023-24 season) 06/08/2023   Medicare Annual Wellness (AWV)  04/01/2024   Colonoscopy  04/30/2026   INFLUENZA VACCINE  Completed   Zoster Vaccines- Shingrix  Completed   HPV VACCINES  Aged Out   -Needs Prevnar 20 but declines at this time.  He will plan to get later this year. -Also needs tetanus booster. -Has already had flu vaccine this season.  Family history-father died age 61.  He had dementia.  Mom also has late onset dementia.  He has a brother with history of MS diagnosed in his early 12s and also A-fib.  His mother had A-fib as well.  Social history-he is married.  Retired professor Arts administrator.  Never smoked.  Exercises several days per week.  2 daughters and 1 grandchild.  1 daughter had been living in [place] is currently living with him.  He has another daughter who is a Counsellor that lives in [place] area.  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 His smoking use included cigarettes. He started smoking about 50 years ago. He has a 5 pack-year smoking history. He has never used smokeless tobacco. He reports current alcohol use of about 4.0 standard drinks of alcohol per week. He reports that he does not use drugs. family history includes Atrial fibrillation (age of onset: 34) in his brother; Atrial fibrillation (age of onset: 106) in his mother; CAD in his brother; CAD (age of onset: 30) in his paternal uncle; CAD (age of onset: 31) in his brother; Dementia in his father and mother; Hyperlipidemia in his father; Multiple sclerosis in his brother. Allergies  Allergen Reactions   Molds & Smuts Shortness Of  Breath and Other (See Comments)   Quercus Robur Shortness Of Breath   [place] Other (See Comments)     Review of Systems  Constitutional:  Negative for chills, fever, malaise/fatigue and weight loss.  HENT:  Negative for hearing loss.   Eyes:  Negative for blurred vision and double vision.  Respiratory:  Negative for cough and shortness of breath.   Cardiovascular:  Negative for chest pain, palpitations and leg swelling.  Gastrointestinal:  Negative for abdominal pain, blood in stool, constipation and diarrhea.  Genitourinary:  Negative for dysuria.  Skin:  Negative for rash.  Neurological:  Negative for dizziness, speech change,  seizures, loss of consciousness and headaches.  Psychiatric/Behavioral:  Negative for depression.       Objective:     BP 120/72 (BP Location: Left Arm, Patient Position: Sitting, Cuff Size: Normal)   Pulse (!) 59   Temp (!) 97.4 F (36.3 C) (Oral)   Ht 5\' 11"  (1.803 m)   Wt 182 lb 12.8 oz (82.9 kg)   SpO2 98%   BMI 25.50 kg/m  BP Readings from Last 3 Encounters:  05/04/23 120/72  04/26/23 127/71  04/20/23 120/70   Wt Readings from Last 3 Encounters:  05/04/23 182 lb 12.8 oz (82.9 kg)  04/26/23 180 lb (81.6 kg)  04/20/23 186 lb (84.4 kg)      Physical Exam Vitals reviewed.  Constitutional:      General: He is not in acute distress.    Appearance: He is well-developed. He is not ill-appearing.  HENT:     Head: Normocephalic and atraumatic.     Right Ear: External ear normal.     Left Ear: External ear normal.  Eyes:     Conjunctiva/sclera: Conjunctivae normal.     Pupils: Pupils are equal, round, and reactive to light.  Neck:     Thyroid: No thyromegaly.  Cardiovascular:     Rate and Rhythm: Normal rate and regular rhythm.     Heart sounds: Normal heart sounds. No murmur heard. Pulmonary:     Effort: No respiratory distress.     Breath sounds: No wheezing or rales.  Abdominal:     General: Bowel sounds are normal. There is no distension.     Palpations: Abdomen is soft. There is no mass.     Tenderness: There is no abdominal tenderness. There is no guarding or rebound.  Musculoskeletal:     Cervical back: Normal range of motion and neck supple.     Right lower leg: No edema.     Left lower leg: No edema.  Lymphadenopathy:     Cervical: No cervical adenopathy.  Skin:    Findings: No rash.  Neurological:     Mental Status: He is alert and oriented to person, place, and time.     Cranial Nerves: No cranial nerve deficit.      No results found for any visits on 05/04/23.  Last CBC Lab Results  Component Value Date   WBC 6.8 08/14/2022   HGB 16.7  08/14/2022   HCT 46.1 08/14/2022   MCV 94.7 08/14/2022   MCH 34.3 (H) 08/14/2022   RDW 12.1 08/14/2022   PLT 170 08/14/2022   Last metabolic panel Lab Results  Component Value Date   GLUCOSE 131 (H) 08/14/2022   NA 143 08/14/2022   K 5.1 08/14/2022   CL 107 08/14/2022   CO2 26 08/14/2022   BUN 21 08/14/2022   CREATININE 1.07 08/14/2022   GFR 73.75  01/02/2022   CALCIUM 9.1 08/14/2022   PROT 6.6 08/14/2022   ALBUMIN 4.4 01/02/2022   BILITOT 0.9 08/14/2022   ALKPHOS 74 01/02/2022   AST 21 08/14/2022   ALT 19 08/14/2022   ANIONGAP 7 12/03/2015   Last lipids Lab Results  Component Value Date   CHOL 215 (H) 01/02/2022   HDL 44.60 01/02/2022   LDLCALC 140 (H) 12/31/2020   LDLDIRECT 121.0 01/02/2022   TRIG 242.0 (H) 01/02/2022   CHOLHDL 5 01/02/2022   Last hemoglobin A1c No results found for: "HGBA1C" Last thyroid functions Lab Results  Component Value Date   TSH 3.51 01/02/2022      The 10-year ASCVD risk score (Arnett DK, et al., 2019) is: 15.6%    Assessment & Plan:   Problem List Items Addressed This Visit       Unprioritized   Paroxysmal atrial fibrillation (HCC)   Relevant Orders   Lipid panel   Other Visit Diagnoses     Physical exam    -  Primary   Relevant Orders   Lipid panel   Hepatic function panel   High risk medication use       Relevant Orders   Basic metabolic panel   CBC with Differential/Platelet   Prostate cancer screening       Relevant Orders   PSA, Medicare     History Schlein is here today for physical exam.  He has history of A-fib as above currently doing well following ablation procedure at [facility] May 10.  Exercising regularly.  No specific complaints today.  We discussed the following health maintenance items:  -Flu vaccine already given -Recommend Prevnar 20.  He declines today because he had recent COVID-vaccine.  He will plan to get this in the next month or 2 -Also suggested tetanus booster.  He will consider getting  this at local pharmacist. -Continue regular exercise habits -Colonoscopy is due 2027 -Shingrix vaccine already given -Obtain follow-up labs as above  No follow-ups on file.    Evelena Peat, MD

## 2023-05-04 NOTE — Patient Instructions (Signed)
Consider Prevnar 20 (pneumonia vaccine) and Tdap (tetanus booster)

## 2023-06-01 ENCOUNTER — Encounter: Payer: Self-pay | Admitting: Family Medicine

## 2023-06-18 ENCOUNTER — Encounter: Payer: Self-pay | Admitting: Podiatry

## 2023-06-18 ENCOUNTER — Ambulatory Visit: Payer: Medicare PPO | Admitting: Podiatry

## 2023-06-18 DIAGNOSIS — B351 Tinea unguium: Secondary | ICD-10-CM

## 2023-06-18 NOTE — Progress Notes (Unsigned)
Subjective: Chief Complaint  Patient presents with   Nail Problem    Patient states that his left foot hallux , he states that it may be coming off , the fungus underneath the lf hallux has gotten worst , patient states that the right foot also has fungus just not as bad as the left foot.    67 year old male presents the office for above concerns.  He is a still to the left big toenail starting to come off.  He denied any pain with this and no swelling redness or drainage.  Still been using the topical antifungals with the topical antibiotic ointment.  No drainage or pus.  No other concerns today.  Objective: AAO x3, NAD DP/PT pulses palpable bilaterally, CRT less than 3 seconds Left hallux toenail appears to have a new nail starting to grow in and there is a transverse ridge present in the nails listed on the distal one half on the medial aspect.  There is no edema, erythema, drainage or pus or any signs of infection.  There are no open lesions identified at this time. No pain with calf compression, swelling, warmth, erythema  Assessment: Left hallux onychodystrophy, onychomycosis  Plan: -All treatment options discussed with the patient including all alternatives, risks, complications.  -Discussed different treatment options we mutually agreed to hold off on nail removal.  I sharply debrided the loose nail with any complications or bleeding.  He can continue the topical antifungal.  Monitor for any signs or symptoms of infection.  He can hold off on the antibiotic ointment for now. -Toenails are also thickened we discussed urea nail gel if he would like to try it.  -Patient encouraged to call the office with any questions, concerns, change in symptoms.   Vivi Barrack DPM

## 2023-07-17 ENCOUNTER — Encounter: Payer: Self-pay | Admitting: Family Medicine

## 2023-08-24 DIAGNOSIS — Z9889 Other specified postprocedural states: Secondary | ICD-10-CM | POA: Diagnosis not present

## 2023-08-24 DIAGNOSIS — D1800 Hemangioma unspecified site: Secondary | ICD-10-CM | POA: Diagnosis not present

## 2023-08-24 DIAGNOSIS — D1802 Hemangioma of intracranial structures: Secondary | ICD-10-CM | POA: Diagnosis not present

## 2023-08-27 DIAGNOSIS — D1800 Hemangioma unspecified site: Secondary | ICD-10-CM | POA: Diagnosis not present

## 2023-09-03 DIAGNOSIS — D2272 Melanocytic nevi of left lower limb, including hip: Secondary | ICD-10-CM | POA: Diagnosis not present

## 2023-09-03 DIAGNOSIS — L814 Other melanin hyperpigmentation: Secondary | ICD-10-CM | POA: Diagnosis not present

## 2023-09-03 DIAGNOSIS — D225 Melanocytic nevi of trunk: Secondary | ICD-10-CM | POA: Diagnosis not present

## 2023-09-03 DIAGNOSIS — L578 Other skin changes due to chronic exposure to nonionizing radiation: Secondary | ICD-10-CM | POA: Diagnosis not present

## 2023-09-03 DIAGNOSIS — D485 Neoplasm of uncertain behavior of skin: Secondary | ICD-10-CM | POA: Diagnosis not present

## 2023-09-03 DIAGNOSIS — L570 Actinic keratosis: Secondary | ICD-10-CM | POA: Diagnosis not present

## 2023-09-03 DIAGNOSIS — Z86018 Personal history of other benign neoplasm: Secondary | ICD-10-CM | POA: Diagnosis not present

## 2023-09-03 DIAGNOSIS — L820 Inflamed seborrheic keratosis: Secondary | ICD-10-CM | POA: Diagnosis not present

## 2023-09-03 DIAGNOSIS — L723 Sebaceous cyst: Secondary | ICD-10-CM | POA: Diagnosis not present

## 2023-09-24 DIAGNOSIS — H5203 Hypermetropia, bilateral: Secondary | ICD-10-CM | POA: Diagnosis not present

## 2023-09-24 DIAGNOSIS — H25042 Posterior subcapsular polar age-related cataract, left eye: Secondary | ICD-10-CM | POA: Diagnosis not present

## 2023-09-24 DIAGNOSIS — H52203 Unspecified astigmatism, bilateral: Secondary | ICD-10-CM | POA: Diagnosis not present

## 2023-09-24 DIAGNOSIS — H524 Presbyopia: Secondary | ICD-10-CM | POA: Diagnosis not present

## 2023-11-09 DIAGNOSIS — J3489 Other specified disorders of nose and nasal sinuses: Secondary | ICD-10-CM | POA: Diagnosis not present

## 2023-11-09 DIAGNOSIS — J339 Nasal polyp, unspecified: Secondary | ICD-10-CM | POA: Diagnosis not present

## 2023-11-09 DIAGNOSIS — Z9889 Other specified postprocedural states: Secondary | ICD-10-CM | POA: Diagnosis not present

## 2023-11-09 DIAGNOSIS — J301 Allergic rhinitis due to pollen: Secondary | ICD-10-CM | POA: Diagnosis not present

## 2023-11-09 DIAGNOSIS — J324 Chronic pansinusitis: Secondary | ICD-10-CM | POA: Diagnosis not present

## 2023-11-19 DIAGNOSIS — D1802 Hemangioma of intracranial structures: Secondary | ICD-10-CM | POA: Diagnosis not present

## 2023-11-19 DIAGNOSIS — Z9889 Other specified postprocedural states: Secondary | ICD-10-CM | POA: Diagnosis not present

## 2023-11-19 DIAGNOSIS — D1800 Hemangioma unspecified site: Secondary | ICD-10-CM | POA: Diagnosis not present

## 2023-12-07 ENCOUNTER — Telehealth: Payer: Self-pay

## 2023-12-07 NOTE — Telephone Encounter (Signed)
 Copied from CRM [id number]. Topic: Appointments - Appointment Scheduling >> Dec 07, 2023 11:30 AM Rosamond Comes wrote: Patient/patient representative is calling to schedule an appointment. Refer to attachments for appointment information.  Patient calling to schedule appt for physical exam and blood work patient scheduled for 05/09/24 please call patient letting him know about lab orders

## 2023-12-08 NOTE — Telephone Encounter (Signed)
 Patient stated he will discuss labs at time of visit with PCP for CPE

## 2023-12-10 DIAGNOSIS — Z461 Encounter for fitting and adjustment of hearing aid: Secondary | ICD-10-CM | POA: Diagnosis not present

## 2023-12-10 DIAGNOSIS — H903 Sensorineural hearing loss, bilateral: Secondary | ICD-10-CM | POA: Diagnosis not present

## 2024-02-16 DIAGNOSIS — D485 Neoplasm of uncertain behavior of skin: Secondary | ICD-10-CM | POA: Diagnosis not present

## 2024-02-16 DIAGNOSIS — L820 Inflamed seborrheic keratosis: Secondary | ICD-10-CM | POA: Diagnosis not present

## 2024-03-07 DIAGNOSIS — L820 Inflamed seborrheic keratosis: Secondary | ICD-10-CM | POA: Diagnosis not present

## 2024-04-05 ENCOUNTER — Ambulatory Visit (INDEPENDENT_AMBULATORY_CARE_PROVIDER_SITE_OTHER): Payer: Medicare PPO

## 2024-04-05 VITALS — BP 120/60 | HR 67 | Temp 98.4°F | Ht 71.0 in | Wt 184.4 lb

## 2024-04-05 DIAGNOSIS — Z0000 Encounter for general adult medical examination without abnormal findings: Secondary | ICD-10-CM

## 2024-04-05 NOTE — Patient Instructions (Addendum)
 [name],  Thank you for taking the time for your Medicare Wellness Visit. I appreciate your continued commitment to your health goals. Please review the care plan we discussed, and feel free to reach out if I can assist you further.  Medicare recommends these wellness visits once per year to help you and your care team stay ahead of potential health issues. These visits are designed to focus on prevention, allowing your provider to concentrate on managing your acute and chronic conditions during your regular appointments.  Please note that Annual Wellness Visits do not include a physical exam. Some assessments may be limited, especially if the visit was conducted virtually. If needed, we may recommend a separate in-person follow-up with your provider.  Ongoing Care Seeing your primary care provider every 3 to 6 months helps us  monitor your health and provide consistent, personalized care.   Referrals If a referral was made during today's visit and you haven't received any updates within two weeks, please contact the referred provider directly to check on the status.  Recommended Screenings:  Health Maintenance  Topic Date Due   DTaP/Tdap/Td vaccine (3 - Td or Tdap) 10/06/2022   Flu Shot  02/11/2024   COVID-19 Vaccine (6 - Pfizer risk 2024-25 season) 03/13/2024   Hepatitis C Screening  04/28/2037*   Medicare Annual Wellness Visit  04/05/2025   Colon Cancer Screening  04/30/2026   Pneumococcal Vaccine for age over 25  Completed   Zoster (Shingles) Vaccine  Completed   HPV Vaccine  Aged Out   Meningitis B Vaccine  Aged Out  *Topic was postponed. The date shown is not the original due date.       04/05/2024   11:00 AM  Advanced Directives  Does Patient Have a Medical Advance Directive? Yes  Type of Estate agent of [place];Living will  Copy of Healthcare Power of Attorney in Chart? No - copy requested   Advance Care Planning is important because  it: Ensures you receive medical care that aligns with your values, goals, and preferences. Provides guidance to your family and loved ones, reducing the emotional burden of decision-making during critical moments.  Vision: Annual vision screenings are recommended for early detection of glaucoma, cataracts, and diabetic retinopathy. These exams can also reveal signs of chronic conditions such as diabetes and high blood pressure.  Dental: Annual dental screenings help detect early signs of oral cancer, gum disease, and other conditions linked to overall health, including heart disease and diabetes.  Please see the attached documents for additional preventive care recommendations.

## 2024-04-05 NOTE — Progress Notes (Signed)
 Subjective:   Edwin [name] is a 68 y.o. who presents for a Medicare Wellness preventive visit.  As a reminder, Annual Wellness Visits don't include a physical exam, and some assessments may be limited, especially if this visit is performed virtually. We may recommend an in-person follow-up visit with your provider if needed.  Visit Complete: In person    Persons Participating in Visit: Patient.  AWV Questionnaire: Yes: Patient Medicare AWV questionnaire was completed by the patient on 04/01/24; I have confirmed that all information answered by patient is correct and no changes since this date.  Cardiac Risk Factors include: advanced age (>73men, >74 women);male gender     Objective:    Today's Vitals   04/05/24 1044  BP: 120/60  Pulse: 67  Temp: 98.4 F (36.9 C)  TempSrc: Oral  SpO2: 96%  Weight: 184 lb 6.4 oz (83.6 kg)  Height: 5' 11 (1.803 m)   Body mass index is 25.72 kg/m.     04/05/2024   11:00 AM 04/02/2023   10:57 AM 03/30/2022   10:52 AM 12/02/2015   11:17 AM 11/20/2015    1:38 PM 12/24/2014    2:13 PM 10/09/2014    2:03 PM  Advanced Directives  Does Patient Have a Medical Advance Directive? Yes Yes Yes Yes  No  No  No   Type of Estate agent of [place];Living will Healthcare Power of [place];Living will Healthcare Power of [place];Living will Healthcare Power of [place];Living will  Healthcare Power of [place];Living will     Does patient want to make changes to medical advance directive?     No - Patient declined     Copy of Healthcare Power of Attorney in Chart? No - copy requested No - copy requested No - copy requested No - copy requested  No - copy requested     Would patient like information on creating a medical advance directive?      No - patient declined information  No - patient declined information      Data saved with a previous flowsheet row definition    Current Medications (verified) Outpatient Encounter  Medications as of 04/05/2024  Medication Sig   budesonide (PULMICORT) 0.5 MG/2ML nebulizer solution Take 0.5 mg by nebulization as needed. Use as directed   ELIQUIS 5 MG TABS tablet Take 5 mg by mouth 2 (two) times daily.   fluticasone  (FLONASE ) 50 MCG/ACT nasal spray SHAKE LIQUID AND USE 1 SPRAY IN EACH NOSTRIL AT BEDTIME   gentamicin  ointment (GARAMYCIN ) 0.1 % Apply 1 Application topically 3 (three) times daily.   metoprolol  succinate (TOPROL -XL) 25 MG 24 hr tablet Take 25 mg by mouth 2 (two) times daily.   sildenafil  (REVATIO ) 20 MG tablet TAKE 2 TO 5 TABLETS BY MOUTH ONE HOUR PRIOR TO SEXUAL ACTIVITY AS NEEDED   No facility-administered encounter medications on file as of 04/05/2024.    Allergies (verified) Molds & smuts, Quercus robur, and [place]  live oak   History: Past Medical History:  Diagnosis Date   Allergy    Arthritis    Family history of adverse reaction to anesthesia    pts father had back surgery 10 years ago developed dementia    Nonobstructive atherosclerosis of coronary artery 2016   cardiac CT   Sinus infection    Past Surgical History:  Procedure Laterality Date   KNEE SURGERY  2009   microfracture surgery; left    OTHER SURGICAL HISTORY  12/02/2016   sinus  ROOT CANAL     TONSILLECTOMY     TOTAL HIP ARTHROPLASTY Right 12/02/2015   Procedure: RIGHT TOTAL HIP ARTHROPLASTY ANTERIOR APPROACH;  Surgeon: Dempsey Moan, MD;  Location: [facility];  Service: Orthopedics;  Laterality: Right;   Family History  Problem Relation Age of Onset   Dementia Mother    Atrial fibrillation Mother 98   Hyperlipidemia Father    Dementia Father    Multiple sclerosis Brother    CAD Brother 26       3 stents   Atrial fibrillation Brother 58   CAD Brother        70% stenosis in on coronary   CAD Paternal Uncle 60       Died of MI   Social History   Socioeconomic History   Marital status: Married    Spouse name: Not on file   Number of children: 2   Years of  education: Not on file   Highest education level: Doctorate  Occupational History   Occupation: Professor of Manufacturing systems engineer  Tobacco Use   Smoking status: Former    Current packs/day: 0.00    Average packs/day: 1 pack/day for 5.0 years (5.0 ttl pk-yrs)    Types: Cigarettes    Start date: 10/01/1972    Quit date: 10/01/1977    Years since quitting: 46.5   Smokeless tobacco: Never  Vaping Use   Vaping status: Never Used  Substance and Sexual Activity   Alcohol use: Yes    Alcohol/week: 4.0 standard drinks of alcohol    Types: 4 Standard drinks or equivalent per week    Comment: beer socially (2 beers per day on average)   Drug use: No   Sexual activity: Not on file  Other Topics Concern   Not on file  Social History Narrative   Lives in [place] with spouse.  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)   Social Drivers of Corporate investment banker Strain: Low Risk  (04/05/2024)   Overall Financial Resource Strain (CARDIA)    Difficulty of Paying Living Expenses: Not hard at all  Food Insecurity: No Food Insecurity (04/05/2024)   Hunger Vital Sign    Worried About Running Out of Food in the Last Year: Never true    Ran Out of Food in the Last Year: Never true  Transportation Needs: No Transportation Needs (04/05/2024)   PRAPARE - Administrator, Civil Service (Medical): No    Lack of Transportation (Non-Medical): No  Physical Activity: Sufficiently Active (04/05/2024)   Exercise Vital Sign    Days of Exercise per Week: 4 days    Minutes of Exercise per Session: 60 min  Stress: No Stress Concern Present (04/05/2024)   [employer] of Occupational Health - Occupational Stress Questionnaire    Feeling of Stress: Not at all  Social Connections: Moderately Integrated (04/05/2024)   Social Connection and Isolation Panel    Frequency of Communication with Friends and Family: More than  three times a week    Frequency of Social Gatherings with Friends and Family: Three times a week    Attends Religious Services: Never    Active Member of Clubs or Organizations: Yes    Attends Engineer, structural: More than 4 times per year    Marital Status: Married    Tobacco Counseling Counseling given: Not Answered    Clinical Intake:  Pre-visit preparation completed: Yes  Pain : No/denies pain     BMI - recorded: 25.72 Nutritional Status: BMI 25 -29 Overweight Nutritional Risks: None Diabetes: No  No results found for: HGBA1C   How often do you need to have someone help you when you read instructions, pamphlets, or other written materials from your doctor or pharmacy?: 1 - Never  Interpreter Needed?: No  Information entered by :: Rojelio Blush LPN   Activities of Daily Living     04/05/2024   10:58 AM 04/01/2024    9:00 AM  In your present state of health, do you have any difficulty performing the following activities:  Hearing? 1 0  Comment Wears Hearing Aids   Vision? 0 0  Difficulty concentrating or making decisions? 0 0  Walking or climbing stairs? 0 0  Dressing or bathing? 0 0  Doing errands, shopping? 0 0  Preparing Food and eating ? N N  Using the Toilet? N N  In the past six months, have you accidently leaked urine? N N  Do you have problems with loss of bowel control? N N  Managing your Medications? N N  Managing your Finances? N N  Housekeeping or managing your Housekeeping? N N    Patient Care Team: Micheal Wolm ORN, MD as PCP - General  I have updated your Care Teams any recent Medical Services you may have received from other providers in the past year.     Assessment:   This is a routine wellness examination for [place].  Hearing/Vision screen Hearing Screening - Comments:: Wears Hearing Aids   Goals Addressed               This Visit's Progress     Increase physical activity (pt-stated)        Remain active  and lose a little weight.       Depression Screen     04/05/2024   10:46 AM 04/02/2023   11:02 AM 12/02/2022   10:45 AM 08/21/2022    4:55 PM 03/30/2022   10:49 AM 01/02/2022    9:11 AM 10/27/2019   11:07 AM  PHQ 2/9 Scores  PHQ - 2 Score 0 0 0 0 0 0 0  PHQ- 9 Score   0        Fall Risk     04/05/2024   10:59 AM 04/01/2024    9:00 AM 04/19/2023   11:46 AM 03/29/2023    9:22 AM 12/02/2022   10:45 AM  Fall Risk   Falls in the past year? 0 0 0 0 0  Number falls in past yr: 0 0  0 0  Injury with Fall? 0 0  0 0  Risk for fall due to : No Fall Risks    No Fall Risks  Follow up Falls evaluation completed    Falls evaluation completed    MEDICARE RISK AT HOME:  Medicare Risk at Home Any stairs in or around the home?: Yes If so, are there any without handrails?: No Home free of loose throw rugs in walkways, pet beds, electrical cords, etc?: No Adequate lighting in your home to reduce risk of falls?: Yes Life alert?: No Use of a cane, walker or w/c?: No Grab bars in the bathroom?: No Shower chair or bench in shower?: No Elevated toilet seat or a handicapped toilet?: No  TIMED UP AND GO:  Was the test performed?  Yes  Length of time to ambulate 10 feet: 10 sec Gait steady and fast without use  of assistive device  Cognitive Function: 6CIT completed        04/05/2024   11:00 AM 04/02/2023   10:57 AM 03/30/2022   10:53 AM  6CIT Screen  What Year? 0 points 0 points 0 points  What month? 0 points 0 points 0 points  What time? 0 points 0 points 0 points  Count back from 20 0 points 0 points 0 points  Months in reverse 0 points 0 points 0 points  Repeat phrase 0 points 0 points 0 points  Total Score 0 points 0 points 0 points    Immunizations Immunization History  Administered Date(s) Administered   Hepatitis A 07/13/2006, 12/12/2006   Influenza,inj,Quad PF,6+ Mos 04/26/2014   Influenza,inj,quad, With Preservative 03/15/2017   Influenza-Unspecified 04/13/2015, 03/18/2017,  05/07/2021, 04/27/2023   PFIZER(Purple Top)SARS-COV-2 Vaccination 08/22/2019, 09/18/2019, 03/25/2020, 11/01/2020   PNEUMOCOCCAL CONJUGATE-20 06/01/2023   Td 07/14/2003   Tdap 10/05/2012   Unspecified SARS-COV-2 Vaccination 04/13/2023   Zoster Recombinant(Shingrix) 12/31/2020, 05/27/2021    Screening Tests Health Maintenance  Topic Date Due   DTaP/Tdap/Td (3 - Td or Tdap) 10/06/2022   Influenza Vaccine  02/11/2024   COVID-19 Vaccine (6 - Pfizer risk 2024-25 season) 03/13/2024   Hepatitis C Screening  04/28/2037 (Originally 01/29/1974)   Medicare Annual Wellness (AWV)  04/05/2025   Colonoscopy  04/30/2026   Pneumococcal Vaccine: 50+ Years  Completed   Zoster Vaccines- Shingrix  Completed   HPV VACCINES  Aged Out   Meningococcal B Vaccine  Aged Out    Health Maintenance Items Addressed:   Additional Screening:  Vision Screening: Recommended annual ophthalmology exams for early detection of glaucoma and other disorders of the eye. Is the patient up to date with their annual eye exam?  Yes  Who is the provider or what is the name of the office in which the patient attends annual eye exams? Dr Robinson  Dental Screening: Recommended annual dental exams for proper oral hygiene  Community Resource Referral / Chronic Care Management: CRR required this visit?  No   CCM required this visit?  No   Plan:    I have personally reviewed and noted the following in the patient's chart:   Medical and social history Use of alcohol, tobacco or illicit drugs  Current medications and supplements including opioid prescriptions. Patient is not currently taking opioid prescriptions. Functional ability and status Nutritional status Physical activity Advanced directives List of other physicians Hospitalizations, surgeries, and ER visits in previous 12 months Vitals Screenings to include cognitive, depression, and falls Referrals and appointments  In addition, I have reviewed and discussed  with patient certain preventive protocols, quality metrics, and best practice recommendations. A written personalized care plan for preventive services as well as general preventive health recommendations were provided to patient.   Rojelio LELON Blush, LPN   [id number]   After Visit Summary: (In Person-Printed) AVS printed and given to the patient  Notes: Nothing significant to report at this time.

## 2024-04-12 DIAGNOSIS — I444 Left anterior fascicular block: Secondary | ICD-10-CM | POA: Diagnosis not present

## 2024-04-12 DIAGNOSIS — R001 Bradycardia, unspecified: Secondary | ICD-10-CM | POA: Diagnosis not present

## 2024-04-12 DIAGNOSIS — Z9889 Other specified postprocedural states: Secondary | ICD-10-CM | POA: Diagnosis not present

## 2024-04-12 DIAGNOSIS — Z8679 Personal history of other diseases of the circulatory system: Secondary | ICD-10-CM | POA: Diagnosis not present

## 2024-05-09 ENCOUNTER — Encounter: Admitting: Family Medicine

## 2024-05-10 ENCOUNTER — Ambulatory Visit: Admitting: Family Medicine

## 2024-05-10 ENCOUNTER — Encounter: Payer: Self-pay | Admitting: Family Medicine

## 2024-05-10 VITALS — BP 130/70 | HR 51 | Temp 98.1°F | Ht 69.69 in | Wt 185.6 lb

## 2024-05-10 DIAGNOSIS — Z0000 Encounter for general adult medical examination without abnormal findings: Secondary | ICD-10-CM | POA: Diagnosis not present

## 2024-05-10 LAB — BASIC METABOLIC PANEL WITH GFR
BUN: 13 mg/dL (ref 6–23)
CO2: 28 meq/L (ref 19–32)
Calcium: 9 mg/dL (ref 8.4–10.5)
Chloride: 106 meq/L (ref 96–112)
Creatinine, Ser: 0.93 mg/dL (ref 0.40–1.50)
GFR: 83.92 mL/min (ref >60.00)
Glucose, Bld: 83 mg/dL (ref 70–99)
Potassium: 4.1 meq/L (ref 3.5–5.1)
Sodium: 142 meq/L (ref 135–145)

## 2024-05-10 LAB — HEPATIC FUNCTION PANEL
ALT: 19 U/L (ref 0–53)
AST: 23 U/L (ref 0–37)
Albumin: 4.4 g/dL (ref 3.5–5.2)
Alkaline Phosphatase: 65 U/L (ref 39–117)
Bilirubin, Direct: 0.2 mg/dL (ref 0.0–0.3)
Total Bilirubin: 0.9 mg/dL (ref 0.2–1.2)
Total Protein: 6.2 g/dL (ref 6.0–8.3)

## 2024-05-10 LAB — CBC WITH DIFFERENTIAL/PLATELET
Basophils Absolute: 0 K/uL (ref 0.0–0.1)
Basophils Relative: 0.1 % (ref 0.0–3.0)
Eosinophils Absolute: 0.2 K/uL (ref 0.0–0.7)
Eosinophils Relative: 4.5 % (ref 0.0–5.0)
HCT: 45 % (ref 39.0–52.0)
Hemoglobin: 15.5 g/dL (ref 13.0–17.0)
Lymphocytes Relative: 22 % (ref 12.0–46.0)
Lymphs Abs: 1.1 K/uL (ref 0.7–4.0)
MCHC: 34.4 g/dL (ref 30.0–36.0)
MCV: 99.7 fl (ref 78.0–100.0)
Monocytes Absolute: 0.4 K/uL (ref 0.1–1.0)
Monocytes Relative: 8 % (ref 3.0–12.0)
Neutro Abs: 3.3 K/uL (ref 1.4–7.7)
Neutrophils Relative %: 65.4 % (ref 43.0–77.0)
Platelets: 194 K/uL (ref 150.0–400.0)
RBC: 4.52 Mil/uL (ref 4.22–5.81)
RDW: 13.3 % (ref 11.5–15.5)
WBC: 5 K/uL (ref 4.0–10.5)

## 2024-05-10 LAB — LIPID PANEL
Cholesterol: 199 mg/dL (ref 0–200)
HDL: 44.9 mg/dL (ref >39.00)
LDL Cholesterol: 131 mg/dL — ABNORMAL HIGH (ref 0–99)
NonHDL: 154.42
Total CHOL/HDL Ratio: 4
Triglycerides: 117 mg/dL (ref 0.0–149.0)
VLDL: 23.4 mg/dL (ref 0.0–40.0)

## 2024-05-10 LAB — PSA, MEDICARE: PSA: 2.03 ng/mL (ref 0.10–4.00)

## 2024-05-10 MED ORDER — SILDENAFIL CITRATE 50 MG PO TABS: 50.0000 mg | ORAL_TABLET | Freq: Every day | ORAL | 5 refills | Status: AC | PRN

## 2024-05-10 NOTE — Progress Notes (Signed)
 Established Patient Office Visit  Subjective   Patient ID: Edwin [name], male    DOB: 12/25/1955  Age: 68 y.o. MRN: [id number]  Chief Complaint  Patient presents with   Annual Exam    HPI   Edwin [name] is seen for physical exam.  He continues to enjoy retirement.  He and his wife just got back recently from [place]  tour this past summer.  He has history of paroxysmal atrial fibrillation.  Saw EP specialist [facility] and had ablation a couple years ago.  No recurrent A-fib since then.  Still maintained on Eliquis and metoprolol .  He has osteoarthritis with prior hip replacement.  Generally doing well.  Feels well overall.  Exercises regularly.  Does have history of some ED and requesting refill of Viagra .  Health maintenance reviewed:  Health Maintenance  Topic Date Due   COVID-19 Vaccine (6 - 2025-26 season) 03/13/2024   Hepatitis C Screening  04/28/2037 (Originally 01/29/1974)   Medicare Annual Wellness (AWV)  04/05/2025   Colonoscopy  04/30/2026   DTaP/Tdap/Td (4 - Td or Tdap) 10/05/2032   Pneumococcal Vaccine: 50+ Years  Completed   Influenza Vaccine  Completed   Zoster Vaccines- Shingrix  Completed   Meningococcal B Vaccine  Aged Out   - Colonoscopy due 2027 - Flu vaccine already given - Other vaccines including Shingrix and pneumonia complete.  Tetanus also up-to-date  Family history-father died age 24.  He had dementia.  Mom just passed away recently from complications of dementia at age 107.  He has a brother with history of MS diagnosed in his early 59s and also A-fib.  His mother had A-fib as well.   Social history-he is married.  Retired professor ARTS ADMINISTRATOR.  Never smoked.  Exercises several days per week.  2 daughters and 1 grandchild.  1 daughter had been living in [place] is currently living with him.  He has another daughter who is a counsellor that lives in [place] area.  Past Medical History:  Diagnosis Date   Allergy    Arthritis    Family history  of adverse reaction to anesthesia    pts father had back surgery 10 years ago developed dementia    Nonobstructive atherosclerosis of coronary artery 2016   cardiac CT   Sinus infection    Past Surgical History:  Procedure Laterality Date   KNEE SURGERY  2009   microfracture surgery; left    OTHER SURGICAL HISTORY  12/02/2016   sinus    ROOT CANAL     TONSILLECTOMY     TOTAL HIP ARTHROPLASTY Right 12/02/2015   Procedure: RIGHT TOTAL HIP ARTHROPLASTY ANTERIOR APPROACH;  Surgeon: Dempsey Moan, MD;  Location: [facility];  Service: Orthopedics;  Laterality: Right;    reports that he quit smoking about 46 years ago. His smoking use included cigarettes. He started smoking about 51 years ago. He has a 5 pack-year smoking history. He has never used smokeless tobacco. He reports current alcohol use of about 4.0 standard drinks of alcohol per week. He reports that he does not use drugs. family history includes Atrial fibrillation (age of onset: 75) in his brother; Atrial fibrillation (age of onset: 52) in his mother; CAD in his brother; CAD (age of onset: 83) in his paternal uncle; CAD (age of onset: 26) in his brother; Dementia in his father and mother; Hyperlipidemia in his father; Multiple sclerosis in his brother. Allergies  Allergen Reactions   Molds & Smuts Shortness Of Breath and Other (See  Comments)   Quercus Robur Shortness Of Breath   [place]  Live Oak Other (See Comments)     Review of Systems  Constitutional:  Negative for chills, fever, malaise/fatigue and weight loss.  HENT:  Negative for hearing loss.   Eyes:  Negative for blurred vision and double vision.  Respiratory:  Negative for cough and shortness of breath.   Cardiovascular:  Negative for chest pain, palpitations and leg swelling.  Gastrointestinal:  Negative for abdominal pain, blood in stool, constipation and diarrhea.  Genitourinary:  Negative for dysuria.  Skin:  Negative for rash.  Neurological:  Negative for  dizziness, speech change, seizures, loss of consciousness and headaches.  Psychiatric/Behavioral:  Negative for depression.       Objective:     BP 130/70   Pulse (!) 51   Temp 98.1 F (36.7 C) (Oral)   Ht 5' 9.69 (1.77 m)   Wt 185 lb 9.6 oz (84.2 kg)   SpO2 96%   BMI 26.87 kg/m  BP Readings from Last 3 Encounters:  05/10/24 130/70  04/05/24 120/60  05/04/23 120/72   Wt Readings from Last 3 Encounters:  05/10/24 185 lb 9.6 oz (84.2 kg)  04/05/24 184 lb 6.4 oz (83.6 kg)  05/04/23 182 lb 12.8 oz (82.9 kg)      Physical Exam Vitals reviewed.  Constitutional:      General: He is not in acute distress.    Appearance: He is well-developed. He is not ill-appearing.  HENT:     Head: Normocephalic and atraumatic.     Right Ear: External ear normal.     Left Ear: External ear normal.  Eyes:     Conjunctiva/sclera: Conjunctivae normal.     Pupils: Pupils are equal, round, and reactive to light.  Neck:     Thyroid : No thyromegaly.  Cardiovascular:     Rate and Rhythm: Normal rate and regular rhythm.     Heart sounds: Normal heart sounds. No murmur heard. Pulmonary:     Effort: No respiratory distress.     Breath sounds: No wheezing or rales.  Abdominal:     General: Bowel sounds are normal. There is no distension.     Palpations: Abdomen is soft. There is no mass.     Tenderness: There is no abdominal tenderness. There is no guarding or rebound.  Musculoskeletal:     Cervical back: Normal range of motion and neck supple.     Right lower leg: No edema.     Left lower leg: No edema.  Lymphadenopathy:     Cervical: No cervical adenopathy.  Skin:    Findings: No rash.  Neurological:     Mental Status: He is alert and oriented to person, place, and time.     Cranial Nerves: No cranial nerve deficit.      No results found for any visits on 05/10/24.    The 10-year ASCVD risk score (Arnett DK, et al., 2019) is: 18.6%    Assessment & Plan:   Problem List  Items Addressed This Visit   None Visit Diagnoses       Physical exam    -  Primary   Relevant Orders   PSA, Medicare   Basic metabolic panel with GFR   Lipid panel   CBC with Differential/Platelet   Hepatic function panel     Health-conscious 68 year old male who currently feels well with chronic issues as above.  He has history of A-fib and has done well since ablation 2 years  ago.  Still maintained on metoprolol  and Eliquis per cardiologist.  We discussed the following health maintenance items  -Continue regular exercise habits - Vaccines up-to-date - Consider RSV vaccine by age 70 - Repeat colonoscopy in 2 years - Obtain labs as above - Refill Viagra  to use as needed for erectile dysfunction  No follow-ups on file.    Wolm Scarlet, MD

## 2024-05-11 ENCOUNTER — Ambulatory Visit: Payer: Self-pay | Admitting: Family Medicine

## 2024-08-16 ENCOUNTER — Ambulatory Visit: Admitting: Family Medicine

## 2024-08-16 ENCOUNTER — Encounter: Payer: Self-pay | Admitting: Family Medicine

## 2024-08-16 VITALS — BP 136/66 | HR 81 | Temp 98.1°F | Wt 186.8 lb

## 2024-08-16 DIAGNOSIS — J0191 Acute recurrent sinusitis, unspecified: Secondary | ICD-10-CM | POA: Diagnosis not present

## 2024-08-16 MED ORDER — CEFDINIR 300 MG PO CAPS: 300.0000 mg | ORAL_CAPSULE | Freq: Two times a day (BID) | ORAL | 0 refills | Status: AC

## 2024-08-16 NOTE — Progress Notes (Signed)
 "  Established Patient Office Visit  Subjective   Patient ID: Edwin [name], male    DOB: [birth year]  Age: 69 y.o. MRN: [id number]  Chief Complaint  Patient presents with   Cough   Nasal Congestion   Sore Throat    HPI    Edwin [name] is seen with onset about 3 days ago of laryngitis, productive cough, sore throat, and nasal congestion.  He had sinus surgery back in 2017.  He has done better since that time but still has occasional recurrent sinusitis episodes.  He does daily nasal saline irrigation and also irrigation with budesonide solution that his ENT has prescribed.  His wife has been battling with similar symptoms for about 2 weeks now.  He does have some productive cough.  Previously, when he developed combination of sore throat, productive cough, laryngitis he has typically had several weeks of ongoing sinusitis issues.  Denies any fever.  No chronic lung issues.  Past Medical History:  Diagnosis Date   Allergy    Arthritis    Family history of adverse reaction to anesthesia    pts father had back surgery 10 years ago developed dementia    Nonobstructive atherosclerosis of coronary artery 2016   cardiac CT   Sinus infection    Past Surgical History:  Procedure Laterality Date   KNEE SURGERY  2009   microfracture surgery; left    OTHER SURGICAL HISTORY  12/02/2016   sinus    ROOT CANAL     TONSILLECTOMY     TOTAL HIP ARTHROPLASTY Right 12/02/2015   Procedure: RIGHT TOTAL HIP ARTHROPLASTY ANTERIOR APPROACH;  Surgeon: Dempsey Moan, MD;  Location: [facility];  Service: Orthopedics;  Laterality: Right;    reports that he quit smoking about 46 years ago. His smoking use included cigarettes. He started smoking about 51 years ago. He has a 5 pack-year smoking history. He has never used smokeless tobacco. He reports current alcohol use of about 4.0 standard drinks of alcohol per week. He reports that he does not use drugs. family history includes Atrial fibrillation (age of onset:  65) in his brother; Atrial fibrillation (age of onset: 84) in his mother; CAD in his brother; CAD (age of onset: 71) in his paternal uncle; CAD (age of onset: 40) in his brother; Dementia in his father and mother; Hyperlipidemia in his father; Multiple sclerosis in his brother. Allergies[1]  Review of Systems  Constitutional:  Positive for malaise/fatigue. Negative for chills and fever.  HENT:  Positive for congestion, sinus pain and sore throat.   Respiratory:  Positive for cough.       Objective:     BP 136/66   Pulse 81   Temp 98.1 F (36.7 C) (Oral)   Wt 186 lb 12.8 oz (84.7 kg)   SpO2 96%   BMI 27.05 kg/m  BP Readings from Last 3 Encounters:  08/16/24 136/66  05/10/24 130/70  04/05/24 120/60   Wt Readings from Last 3 Encounters:  08/16/24 186 lb 12.8 oz (84.7 kg)  05/10/24 185 lb 9.6 oz (84.2 kg)  04/05/24 184 lb 6.4 oz (83.6 kg)      Physical Exam Vitals reviewed.  Constitutional:      General: He is not in acute distress.    Appearance: He is not ill-appearing.  HENT:     Right Ear: Tympanic membrane normal.     Left Ear: Tympanic membrane normal.     Nose:     Comments: Erythematous nasal mucosa.  He  has some thick yellow purulent appearing secretions intranasally bilaterally    Mouth/Throat:     Mouth: Mucous membranes are moist.     Pharynx: Oropharynx is clear.  Cardiovascular:     Rate and Rhythm: Normal rate and regular rhythm.  Pulmonary:     Effort: Pulmonary effort is normal.     Breath sounds: Normal breath sounds. No wheezing or rales.  Musculoskeletal:     Cervical back: Neck supple.  Neurological:     Mental Status: He is alert.      No results found for any visits on 08/16/24.    The 10-year ASCVD risk score (Arnett DK, et al., 2019) is: 19.5%    Assessment & Plan:   Recurrent sinusitis.  History of prior sinus surgery 2017.  Even though he had relatively brief duration of symptoms he has had severe progressive symptoms in the  past when developing facial pain, laryngitis, sore throat for several days.  We elected to go and start Omnicef  300 mg twice daily for 10 days.  Stay well-hydrated.  Follow-up for any persistent or worsening symptoms  Wolm Scarlet, MD     [1]  Allergies Allergen Reactions   Molds & Smuts Shortness Of Breath and Other (See Comments)   Quercus Robur Shortness Of Breath   [place]  Live Oak Other (See Comments)   "

## 2025-04-13 ENCOUNTER — Ambulatory Visit
